# Patient Record
Sex: Female | Born: 1960 | Race: Black or African American | Hispanic: No | Marital: Single | Smoking: Never smoker
Health system: Southern US, Community
[De-identification: ages and names within clinical notes are randomized; demographics above are authoritative.]

## PROBLEM LIST (undated history)

## (undated) DIAGNOSIS — H409 Unspecified glaucoma: Secondary | ICD-10-CM

## (undated) DIAGNOSIS — K649 Unspecified hemorrhoids: Secondary | ICD-10-CM

## (undated) HISTORY — PX: GLAUCOMA SURGERY: SHX656

---

## 1998-08-21 ENCOUNTER — Ambulatory Visit: Admission: RE | Admit: 1998-08-21 | Discharge: 1998-08-21 | Payer: Self-pay | Admitting: *Deleted

## 1998-08-22 ENCOUNTER — Ambulatory Visit: Admission: RE | Admit: 1998-08-22 | Discharge: 1998-08-22 | Payer: Self-pay | Admitting: Internal Medicine

## 1998-08-24 ENCOUNTER — Ambulatory Visit: Admission: RE | Admit: 1998-08-24 | Discharge: 1998-08-24 | Payer: Self-pay | Admitting: Internal Medicine

## 2000-01-04 ENCOUNTER — Other Ambulatory Visit: Admission: RE | Admit: 2000-01-04 | Discharge: 2000-01-04 | Payer: Self-pay | Admitting: Obstetrics and Gynecology

## 2000-01-23 ENCOUNTER — Encounter: Payer: Self-pay | Admitting: Obstetrics and Gynecology

## 2000-01-23 ENCOUNTER — Ambulatory Visit (HOSPITAL_COMMUNITY): Admission: RE | Admit: 2000-01-23 | Discharge: 2000-01-23 | Payer: Self-pay | Admitting: Obstetrics and Gynecology

## 2001-07-02 ENCOUNTER — Other Ambulatory Visit: Admission: RE | Admit: 2001-07-02 | Discharge: 2001-07-02 | Payer: Self-pay | Admitting: Obstetrics and Gynecology

## 2001-07-31 ENCOUNTER — Emergency Department (HOSPITAL_COMMUNITY): Admission: EM | Admit: 2001-07-31 | Discharge: 2001-07-31 | Payer: Self-pay | Admitting: Emergency Medicine

## 2016-05-13 ENCOUNTER — Other Ambulatory Visit (HOSPITAL_COMMUNITY): Payer: Self-pay | Admitting: Ophthalmology

## 2016-05-13 DIAGNOSIS — H5340 Unspecified visual field defects: Secondary | ICD-10-CM

## 2016-05-21 ENCOUNTER — Ambulatory Visit (HOSPITAL_COMMUNITY): Admission: RE | Admit: 2016-05-21 | Payer: Self-pay | Source: Ambulatory Visit

## 2016-05-21 ENCOUNTER — Ambulatory Visit (HOSPITAL_COMMUNITY): Payer: Self-pay | Attending: Ophthalmology

## 2016-06-04 ENCOUNTER — Encounter (HOSPITAL_COMMUNITY): Payer: Self-pay

## 2016-06-04 ENCOUNTER — Ambulatory Visit (HOSPITAL_COMMUNITY)
Admission: RE | Admit: 2016-06-04 | Discharge: 2016-06-04 | Disposition: A | Discharge status: Home / Self Care | Payer: No Typology Code available for payment source | Source: Ambulatory Visit | Attending: Ophthalmology | Admitting: Ophthalmology

## 2016-06-04 DIAGNOSIS — R220 Localized swelling, mass and lump, head: Secondary | ICD-10-CM | POA: Insufficient documentation

## 2016-06-04 DIAGNOSIS — R9082 White matter disease, unspecified: Secondary | ICD-10-CM | POA: Insufficient documentation

## 2016-06-04 DIAGNOSIS — H5340 Unspecified visual field defects: Secondary | ICD-10-CM

## 2016-06-04 DIAGNOSIS — G319 Degenerative disease of nervous system, unspecified: Secondary | ICD-10-CM | POA: Insufficient documentation

## 2016-06-04 MED ORDER — GADOBENATE DIMEGLUMINE 529 MG/ML IV SOLN
15.0000 mL | Freq: Once | INTRAVENOUS | Status: AC
Start: 2016-06-04 — End: 2016-06-04
  Administered 2016-06-04: 13 mL via INTRAVENOUS

## 2016-06-22 ENCOUNTER — Ambulatory Visit (HOSPITAL_COMMUNITY)
Admission: EM | Admit: 2016-06-22 | Discharge: 2016-06-22 | Disposition: A | Discharge status: Home / Self Care | Payer: PRIVATE HEALTH INSURANCE | Attending: Radiology | Admitting: Radiology

## 2016-06-22 ENCOUNTER — Encounter (HOSPITAL_COMMUNITY): Payer: Self-pay | Admitting: Emergency Medicine

## 2016-06-22 DIAGNOSIS — R21 Rash and other nonspecific skin eruption: Secondary | ICD-10-CM | POA: Diagnosis not present

## 2016-06-22 HISTORY — DX: Unspecified glaucoma: H40.9

## 2016-06-22 MED ORDER — PERMETHRIN 5 % EX CREA: 1.0000 "application " | TOPICAL_CREAM | Freq: Once | CUTANEOUS | 2 refills | Status: AC

## 2016-06-22 NOTE — ED Provider Notes (Signed)
CSN: [id number]     Arrival [id number] & time 06/22/16  1202 History   None    Chief Complaint  Patient presents with  . Rash   (Consider location/radiation/quality/duration/timing/severity/associated sxs/prior Treatment) Patient presents with diffuse rash to her entire body X 1.5 months  X. Condition is acute in nature. Condition is made better by nothing. Condition is made worse at night and when the patient puts her jacket on. Patient denies any relief from acetone, benadryl and hydrocortisone cream prior to there arrival at this facility. Patient's husband as similar signs and symptoms. Patient denies any fevers, respiratory illness.       Past Medical History:  Diagnosis Date  . Glaucoma    History reviewed. No pertinent surgical history. History reviewed. No pertinent family history. Social History  Substance Use Topics  . Smoking status: Never Smoker  . Smokeless tobacco: Never Used  . Alcohol use No   OB History    No data available     Review of Systems  Constitutional: Negative.   HENT: Negative.   Respiratory: Negative.   Skin: Positive for rash (erythemic).    Allergies  Review of patient's allergies indicates no known allergies.  Home Medications   Prior to Admission medications   Medication Sig Start Date End Date Taking? Authorizing Provider  permethrin (ACTICIN) 5 % cream Apply 1 application topically once. 06/22/16 06/22/16  Alene MiresJennifer C Mykel Sponaugle, NP   Meds Ordered and Administered this Visit  Medications - No data to display  BP 169/86 (BP Location: Left Arm)   Pulse (!) 48   Temp 98.3 F (36.8 C) (Oral)   Resp 16   SpO2 100%  No data found.   Physical Exam  Constitutional: She is oriented to person, place, and time. She appears well-developed and well-nourished.  Neurological: She is alert and oriented to person, place, and time.  Skin: Skin is warm and dry. Rash noted. There is erythema.  Red bumps noted in between fingers up arm and at  hairline.    Urgent Care Course   Clinical Course    Procedures (including critical care time)  Labs Review Labs Reviewed - No data to display  Imaging Review No results found.   Visual Acuity Review  Right Eye Distance:   Left Eye Distance:   Bilateral Distance:    Right Eye Near:   Left Eye Near:    Bilateral Near:         MDM   1. Rash       Alene MiresJennifer C Toby Breithaupt, NP 06/22/16 1254

## 2016-06-22 NOTE — ED Triage Notes (Signed)
Pt c/o rash all over body onset 1.5 months... States husband started first w/rash and a few days later, she began  Denies fevers, chills, new meds  A&O x4... NAD

## 2016-06-22 NOTE — Discharge Instructions (Signed)
Apply to as directed once. Continue to use benadryl as directed at the back of the box

## 2018-01-11 IMAGING — MR MR HEAD WO/W CM
14 of 20 series · 32 of 48 positions shown · IV contrast (multihance)
Comparison: None.

CLINICAL DATA: 54-year-old female with glaucoma since mid 40s.
Visual field defect. No prior surgery. No history of cancer. Initial
encounter.

EXAM:
MRI HEAD AND ORBITS WITHOUT AND WITH CONTRAST
TECHNIQUE: Multiplanar, multiecho pulse sequences of the brain and surrounding
structures were obtained without and with intravenous contrast.
Multiplanar, multiecho pulse sequences of the orbits and surrounding
structures were obtained including fat saturation techniques, before
and after intravenous contrast administration.
CONTRAST:  13mL MULTIHANCE GADOBENATE DIMEGLUMINE 529 MG/ML IV SOLN

[Series 3: DWI · axial · 3.0mm · 0.94mm/px · z∈[-91,+33]mm · 7 of 92 slices shown (1 of 2)]
[im 1/92]
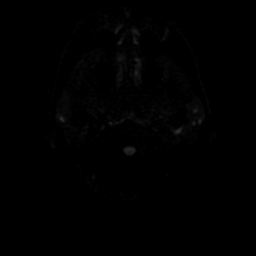
[im 16/92]
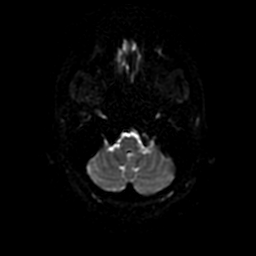
[im 31/92]
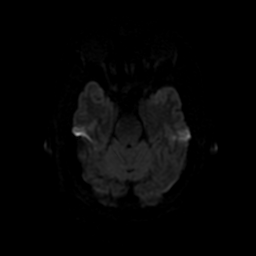
[im 46/92]
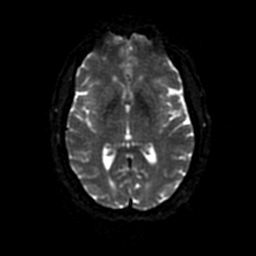
[im 61/92]
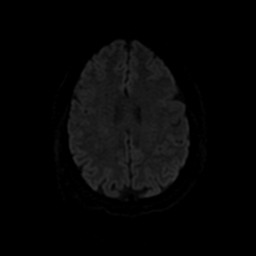
[im 76/92]
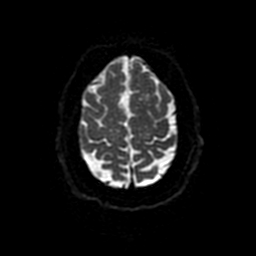
[im 92/92]
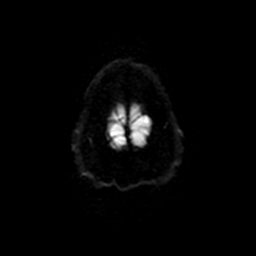

[Series 4: FLAIR · sagittal · 5.0mm · 0.47mm/px · 2 of 22 slices shown (1 of 3)]
[im 1/22]
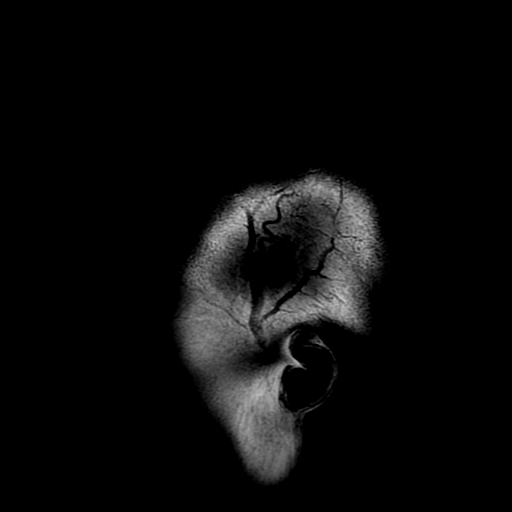
[im 22/22]
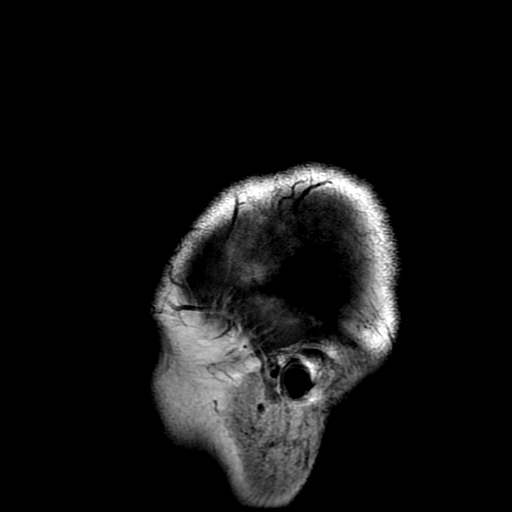

[Series 6: T2 · axial · 5.0mm · 0.47mm/px · z∈[-90,+31]mm · 2 of 23 slices shown]
[im 1/23]
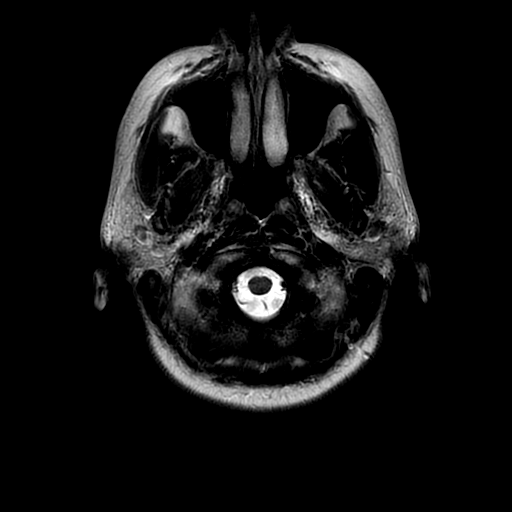
[im 23/23]
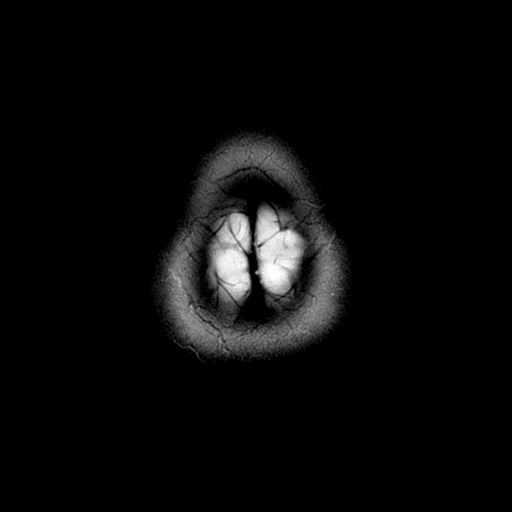

[Series 7: FLAIR · axial · 5.0mm · 0.94mm/px · z∈[-90,+31]mm · 2 of 23 slices shown (2 of 3)]
[im 1/23]
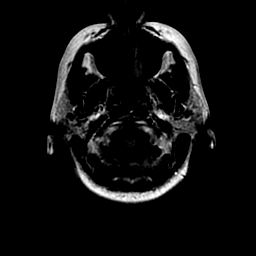
[im 23/23]
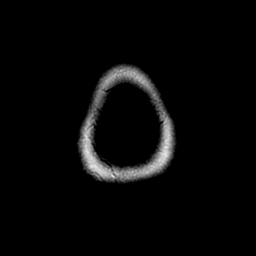

[Series 8: DWI · coronal · 4.0mm · 0.94mm/px · 4 of 68 slices shown (2 of 2)]
[im 1/68]
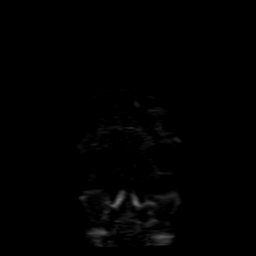
[im 23/68]
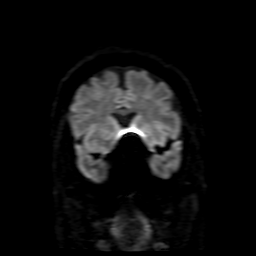
[im 45/68]
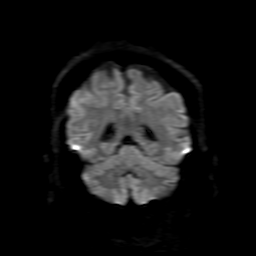
[im 68/68]
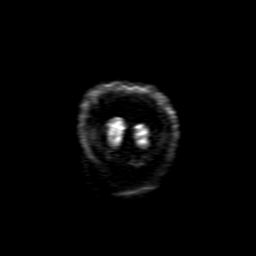

[Series 11: T2 fat-sat · coronal · 4.0mm · 0.35mm/px · 1 of 20 slices shown (1 of 2)]
[im 1/20]
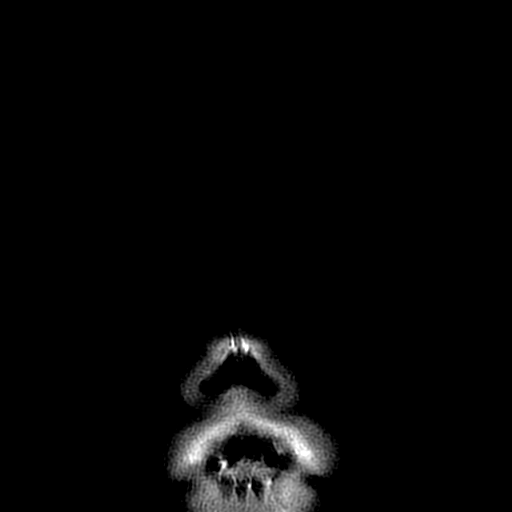

[Series 14: T2 fat-sat · axial · 3.0mm · 0.35mm/px · 1 of 20 slices shown (2 of 2)]
[im 1/20]
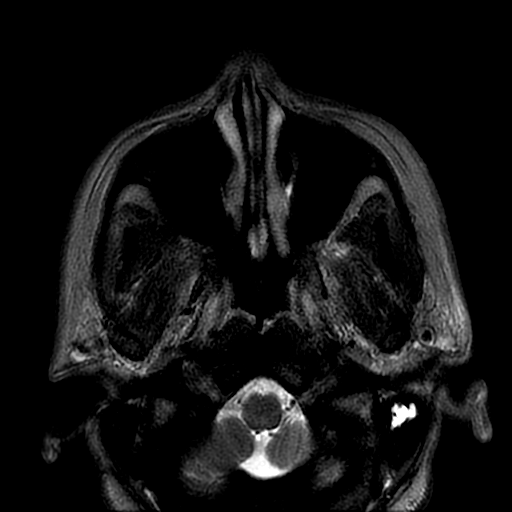

[Series 15: T1 · axial · 3.0mm · 0.35mm/px · 1 of 20 slices shown (1 of 2)]
[im 1/20]
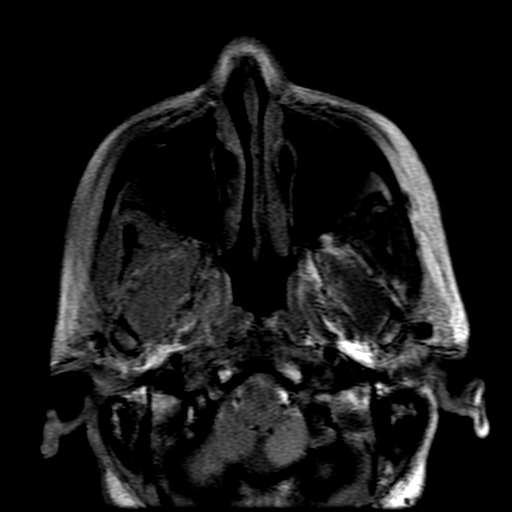

[Series 16: T1 · coronal · 4.0mm · 0.35mm/px · 1 of 20 slices shown (2 of 2)]
[im 1/20]
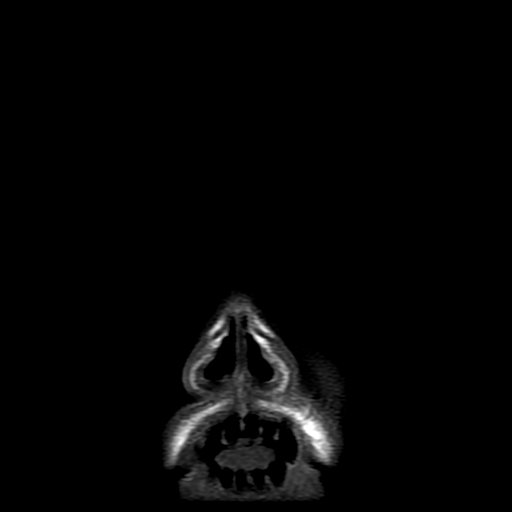

[Series 17: T2 post-contrast · coronal · 5.0mm · 0.39mm/px · 2 of 28 slices shown]
[im 1/28]
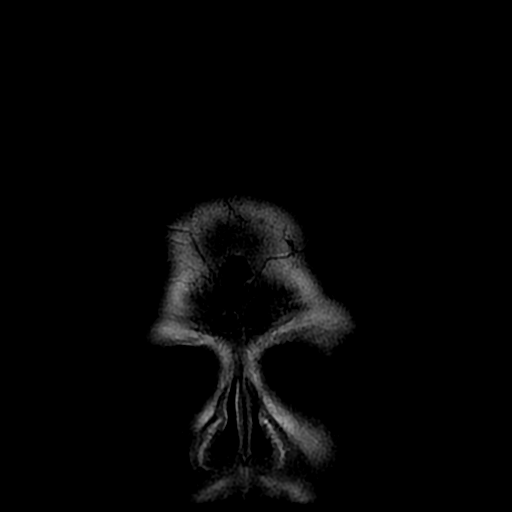
[im 28/28]
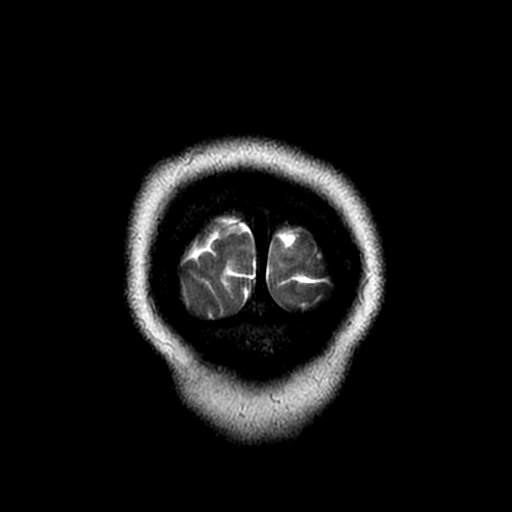

[Series 22: FLAIR · sagittal · 5.0mm · 0.47mm/px · 1 of 22 slices shown (3 of 3)]
[im 1/22]
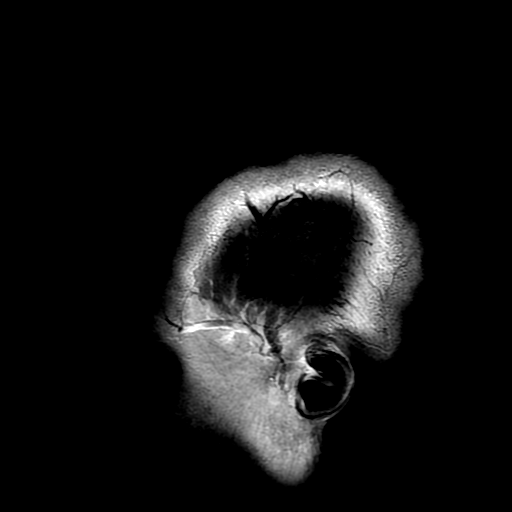

[Series 350: ADC · axial · 3.0mm · 0.94mm/px · z∈[-91,+33]mm · 3 of 46 slices shown (1 of 3)]
[im 1/46]
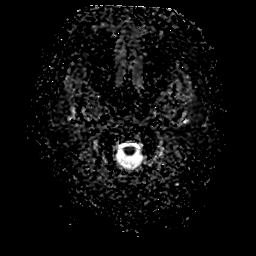
[im 23/46]
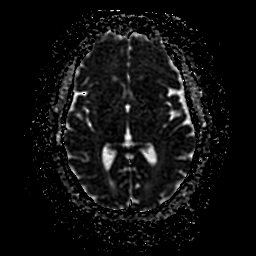
[im 46/46]
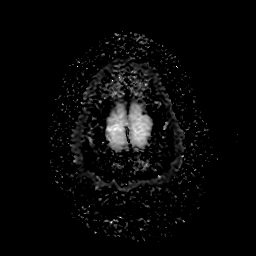

[Series 351: ADC · axial · 3.0mm · 0.94mm/px · z∈[-91,+33]mm · 3 of 46 slices shown (2 of 3)]
[im 1/46]
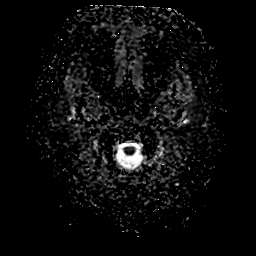
[im 23/46]
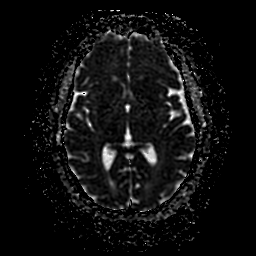
[im 46/46]
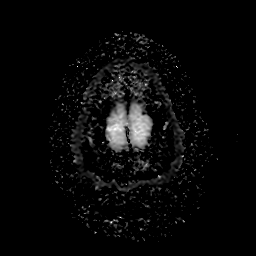

[Series 850: ADC · coronal · 4.0mm · 0.94mm/px · 2 of 34 slices shown (3 of 3)]
[im 1/34]
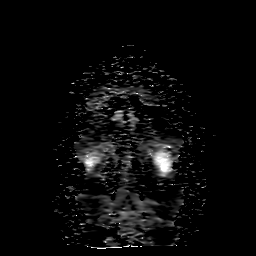
[im 34/34]
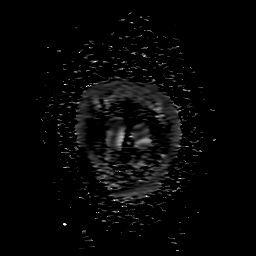

[32 of 48 positions shown; findings below may reference images not displayed]

FINDINGS: MRI HEAD FINDINGS

No acute infarct or intracranial hemorrhage.

Plaque-like mass situated between the posterior aspect of the left
aspect of the clivus and the ventral aspect of the left pons most
suggestive of an en plaque meningioma with maximal transverse
dimension of 1.4 x 0.8 cm.

Mild linear dural enhancement in a diffuse fashion. This is
nonspecific finding and can be seen related to remote prior trauma.
No leptomeningeal enhancement to suggest underlying infection.

Low level confluent periventricular and white matter
hyperintensities. This is nonspecific finding and can be seen
secondary to chronic microvascular changes. No typical features of
demyelinating process.

Atrophy most notable parietal lobes without hydrocephalus.

Diminutive size vertebral arteries and basilar artery. This may be
explained by fetal origin of the posterior cerebral arteries. Patent
internal carotid arteries and major dural sinuses.

Minimal partial opacification inferior left mastoid air cells.
Minimal mucosal thickening ethmoid sinus air cells.

Cervical medullary junction unremarkable.

Sella partially empty but not expanded.

MRI ORBITS FINDINGS

Artifact caused by bone air interface of the sphenoid sinus limits
evaluation of the optic nerves at the level of the optic canal.

Optic nerves appear slightly small in caliber without abnormal
enhancement of the optic nerves.

Slightly prominent peri optic spaces. Minimal enhancement optic
sheaths. Question normal versus related to increase pressure.

No primary global abnormality detected.

Symmetric normal appearance of extra-ocular muscles. Symmetric
appearance of lacrimal glands.

Minimal polypoid opacification inferior right maxillary sinus.
IMPRESSION: MRI HEAD

No acute infarct or intracranial hemorrhage.

Plaque-like mass situated between the posterior aspect of the left
aspect of the clivus and the ventral aspect of the left pons most
suggestive of an en plaque meningioma with maximal transverse
dimension of 1.4 x 0.8 cm.

Mild linear dural enhancement in a diffuse fashion. This is
nonspecific finding and can be seen related to remote prior trauma.

Low level confluent periventricular white matter hyperintensities.
This is nonspecific finding and can be seen secondary to chronic
microvascular changes.

Atrophy most notable parietal lobes.

Diminutive size vertebral arteries and basilar artery. This may be
explained by fetal origin of the posterior cerebral arteries.

Minimal partial opacification inferior left mastoid air cells.
Minimal mucosal thickening ethmoid sinus air cells.

MRI ORBITS

Artifact caused by bone air interface of the sphenoid sinus limits
evaluation of the optic nerves at the level of the optic canal.

Optic nerves appear slightly small in caliber without abnormal
enhancement of the optic nerves.

Slightly prominent peri optic spaces. Minimal enhancement optic
sheaths. Question normal versus related to increase pressure.

## 2020-02-26 ENCOUNTER — Ambulatory Visit: Payer: No Typology Code available for payment source

## 2020-02-26 ENCOUNTER — Ambulatory Visit: Payer: No Typology Code available for payment source | Attending: Internal Medicine

## 2020-02-26 DIAGNOSIS — Z23 Encounter for immunization: Secondary | ICD-10-CM

## 2020-02-26 NOTE — Progress Notes (Signed)
   Covid-19 Vaccination Clinic  Name:  Bernarda Erck    MRN: [id number] DOB: 1961-05-16  02/26/2020  Ms. Rosello was observed post Covid-19 immunization for 15 minutes without incident. She was provided with Vaccine Information Sheet and instruction to access the V-Safe system.   Ms. Goga was instructed to call 911 with any severe reactions post vaccine: Marland Kitchen Difficulty breathing  . Swelling of face and throat  . A fast heartbeat  . A bad rash all over body  . Dizziness and weakness   Immunizations Administered    Name Date Dose VIS Date Route   Pfizer COVID-19 Vaccine 02/26/2020  8:22 AM 0.3 mL 12/29/2018 Intramuscular   Manufacturer: [employer], [employer]   Lot: [id number]   NDC: [address]-5

## 2020-03-20 ENCOUNTER — Ambulatory Visit: Payer: No Typology Code available for payment source | Attending: Internal Medicine

## 2020-03-20 DIAGNOSIS — Z23 Encounter for immunization: Secondary | ICD-10-CM

## 2020-03-20 NOTE — Progress Notes (Signed)
   Covid-19 Vaccination Clinic  Name:  Eddie Koc    MRN: [id number] DOB: 08-15-1961  03/20/2020  Ms. Carrillo was observed post Covid-19 immunization for 15 minutes without incident. She was provided with Vaccine Information Sheet and instruction to access the V-Safe system.   Ms. Eads was instructed to call 911 with any severe reactions post vaccine: Marland Kitchen Difficulty breathing  . Swelling of face and throat  . A fast heartbeat  . A bad rash all over body  . Dizziness and weakness   Immunizations Administered    Name Date Dose VIS Date Route   Pfizer COVID-19 Vaccine 03/20/2020  8:22 AM 0.3 mL 12/29/2018 Intramuscular   Manufacturer: [employer], [employer]   Lot: [id number]   NDC: [address]-4

## 2020-05-27 ENCOUNTER — Emergency Department (HOSPITAL_COMMUNITY)
Admission: EM | Admit: 2020-05-27 | Discharge: 2020-05-28 | Disposition: A | Discharge status: Home / Self Care | Payer: No Typology Code available for payment source | Attending: Emergency Medicine | Admitting: Emergency Medicine

## 2020-05-27 ENCOUNTER — Encounter (HOSPITAL_COMMUNITY): Payer: Self-pay

## 2020-05-27 DIAGNOSIS — K644 Residual hemorrhoidal skin tags: Secondary | ICD-10-CM

## 2020-05-27 DIAGNOSIS — K648 Other hemorrhoids: Secondary | ICD-10-CM | POA: Insufficient documentation

## 2020-05-27 DIAGNOSIS — R5383 Other fatigue: Secondary | ICD-10-CM | POA: Insufficient documentation

## 2020-05-27 LAB — CBC
HCT: 29.4 % — ABNORMAL LOW (ref 36.0–46.0)
Hemoglobin: 8.6 g/dL — ABNORMAL LOW (ref 12.0–15.0)
MCH: 24.6 pg — ABNORMAL LOW (ref 26.0–34.0)
MCHC: 29.3 g/dL — ABNORMAL LOW (ref 30.0–36.0)
MCV: 84 fL (ref 80.0–100.0)
Platelets: 240 10*3/uL (ref 150–400)
RBC: 3.5 MIL/uL — ABNORMAL LOW (ref 3.87–5.11)
RDW: 13.6 % (ref 11.5–15.5)
WBC: 4.4 10*3/uL (ref 4.0–10.5)
nRBC: 0 % (ref 0.0–0.2)

## 2020-05-27 LAB — BASIC METABOLIC PANEL
Anion gap: 9 (ref 5–15)
BUN: 9 mg/dL (ref 6–20)
CO2: 22 mmol/L (ref 22–32)
Calcium: 9.2 mg/dL (ref 8.9–10.3)
Chloride: 109 mmol/L (ref 98–111)
Creatinine, Ser: 0.85 mg/dL (ref 0.44–1.00)
GFR calc Af Amer: >60 mL/min (ref >60)
GFR calc non Af Amer: >60 mL/min (ref >60)
Glucose, Bld: 109 mg/dL — ABNORMAL HIGH (ref 70–99)
Potassium: 3.6 mmol/L (ref 3.5–5.1)
Sodium: 140 mmol/L (ref 135–145)

## 2020-05-27 NOTE — ED Triage Notes (Addendum)
Pt arrives to ED w/ c/o rectal pain and constipation that she feels is secondary to hemorrhoids over the last 2 months. Last BM yesterday. Pt endorses increased fatigue over the last couple of weeks.

## 2020-05-28 ENCOUNTER — Other Ambulatory Visit: Payer: Self-pay

## 2020-05-28 MED ORDER — HYDROCORTISONE ACETATE 25 MG RE SUPP
25.0000 mg | Freq: Two times a day (BID) | RECTAL | 0 refills | Status: DC
Start: 2020-05-28 — End: 2021-06-01

## 2020-05-28 NOTE — ED Provider Notes (Signed)
[facility] EMERGENCY DEPARTMENT Provider Note   CSN: [id number] Arrival date & time: 05/27/20  1651     History Chief Complaint  Patient presents with  . Rectal Pain    Julie Dyer is a 59 y.o. female.  HPI    Patient reports she has had rectal pain for several months.  She reports she will have blood mixed in her stool at times.  No fevers or vomiting.  No abdominal pain.  She has tried Preparation H without improvement Her course is worsening.  It is worse with bowel movements. She reports that time she has decreased p.o. intake and this makes her feel weak. No weakness or syncope at this time Past Medical History:  Diagnosis Date  . Glaucoma     There are no problems to display for this patient.   History reviewed. No pertinent surgical history.   OB History   No obstetric history on file.     No family history on file.  Social History   Tobacco Use  . Smoking status: Never Smoker  . Smokeless tobacco: Never Used  Substance Use Topics  . Alcohol use: No  . Drug use: Not on file    Home Medications Prior to Admission medications   Medication Sig Start Date End Date Taking? Authorizing Provider  ALPHAGAN P 0.1 % SOLN Place 1 drop into the left eye 2 (two) times daily. 03/05/20  Yes [provider]  dorzolamide-timolol (COSOPT) 22.3-6.8 MG/ML ophthalmic solution Place 1 drop into the left eye 2 (two) times daily. 05/12/20  Yes [provider]  RHOPRESSA 0.02 % SOLN Place 1 drop into the left eye every evening. 05/08/20  Yes [provider]  VYZULTA 0.024 % SOLN Place 1 drop into the left eye every evening. 04/27/20  Yes [provider]  hydrocortisone (ANUSOL-HC) 25 MG suppository Place 1 suppository (25 mg total) rectally 2 (two) times daily. For 7 days 05/28/20   Zadie Rhine, MD    Allergies    Patient has no known allergies.  Review of Systems   Review of Systems  Constitutional: Positive for  fatigue. Negative for fever.  Gastrointestinal: Positive for blood in stool and rectal pain. Negative for vomiting.  Genitourinary: Negative for hematuria and vaginal bleeding.  Neurological: Negative for syncope.  All other systems reviewed and are negative.   Physical Exam Updated Vital Signs BP (!) 189/122   Pulse (!) 40   Temp 98.3 F (36.8 C) (Oral)   Resp 16   Ht 1.664 m (5' 5.5")   Wt 74.8 kg   SpO2 (!) 88%   BMI 27.04 kg/m   Physical Exam CONSTITUTIONAL: Well developed/well nourished HEAD: Normocephalic/atraumatic EYES: EOMI/PERRL, conjunctiva pink ENMT: Mucous membranes moist NECK: supple no meningeal signs SPINE/BACK:entire spine nontender CV: S1/S2 noted, no murmurs/rubs/gallops noted LUNGS: Lungs are clear to auscultation bilaterally, no apparent distress ABDOMEN: soft, nontender, no rebound or guarding, bowel sounds noted throughout abdomen Rectal-large external hemorrhoids are noted.  No blood or melena.  No obvious mass, no fistula.  No stool was noted.  Female nurse chaperone present for exam NEURO: Pt is awake/alert/appropriate, moves all extremitiesx4.  No facial droop.   EXTREMITIES:  full ROM SKIN: warm, color normal PSYCH: no abnormalities of mood noted, alert and oriented to situation  ED Results / Procedures / Treatments   Labs (all labs ordered are listed, but only abnormal results are displayed) Labs Reviewed  CBC - Abnormal; Notable for the following components:  Result Value   RBC 3.50 (*)    Hemoglobin 8.6 (*)    HCT 29.4 (*)    MCH 24.6 (*)    MCHC 29.3 (*)    All other components within normal limits  BASIC METABOLIC PANEL - Abnormal; Notable for the following components:   Glucose, Bld 109 (*)    All other components within normal limits  POC OCCULT BLOOD, ED    EKG None  Radiology No results found.  Procedures Procedures   Medications Ordered in ED Medications - No data to display  ED Course  I have reviewed the  triage vital signs and the nursing notes.  Pertinent labs  results that were available during my care of the patient were reviewed by me and considered in my medical decision making (see chart for details).    MDM Rules/Calculators/A&P                          PT Presents for rectal pain and hemorrhoids.  There are no masses or abscess noted.  She has large external hemorrhoids that are not actively bleeding.  Will place patient on Anusol.  She will be referred to general surgery for evaluation and potentially surgical management.  Patient reports she has never had GI follow-up, therefore she will need follow-up for colonoscopy in the next month.  Patient understands this is necessary for colon cancer screening Final Clinical Impression(s) / ED Diagnoses Final diagnoses:  External hemorrhoid    Rx / DC Orders ED Discharge Orders         Ordered    hydrocortisone (ANUSOL-HC) 25 MG suppository  2 times daily     Discontinue  Reprint     05/28/20 0618           Zadie Rhine, MD 05/28/20 [phone]

## 2020-05-28 NOTE — ED Provider Notes (Signed)
Suspect her hemoglobin is not acute.  Patient is nonsymptomatic this time.  This will need to be monitored as an outpatient.  Patient was hypertensive and likely due to anxiety.  Pulse ox reading was erroneous, patient was 100% on room air   Zadie Rhine, MD 05/28/20 [phone]

## 2020-09-07 ENCOUNTER — Other Ambulatory Visit: Payer: Self-pay

## 2021-05-29 ENCOUNTER — Encounter (HOSPITAL_COMMUNITY): Payer: Self-pay | Admitting: Internal Medicine

## 2021-05-29 ENCOUNTER — Emergency Department (HOSPITAL_COMMUNITY): Payer: Self-pay

## 2021-05-29 ENCOUNTER — Inpatient Hospital Stay (HOSPITAL_COMMUNITY)
Admission: EM | Admit: 2021-05-29 | Discharge: 2021-06-01 | DRG: 394 | Disposition: A | Discharge status: Home / Self Care | Payer: Self-pay | Attending: Internal Medicine | Admitting: Internal Medicine

## 2021-05-29 ENCOUNTER — Other Ambulatory Visit: Payer: Self-pay

## 2021-05-29 DIAGNOSIS — K644 Residual hemorrhoidal skin tags: Secondary | ICD-10-CM | POA: Diagnosis present

## 2021-05-29 DIAGNOSIS — K648 Other hemorrhoids: Secondary | ICD-10-CM

## 2021-05-29 DIAGNOSIS — K5900 Constipation, unspecified: Secondary | ICD-10-CM

## 2021-05-29 DIAGNOSIS — K649 Unspecified hemorrhoids: Secondary | ICD-10-CM

## 2021-05-29 DIAGNOSIS — K5730 Diverticulosis of large intestine without perforation or abscess without bleeding: Secondary | ICD-10-CM | POA: Diagnosis present

## 2021-05-29 DIAGNOSIS — K581 Irritable bowel syndrome with constipation: Secondary | ICD-10-CM | POA: Diagnosis present

## 2021-05-29 DIAGNOSIS — D62 Acute posthemorrhagic anemia: Secondary | ICD-10-CM | POA: Diagnosis present

## 2021-05-29 DIAGNOSIS — Z20822 Contact with and (suspected) exposure to covid-19: Secondary | ICD-10-CM | POA: Diagnosis present

## 2021-05-29 DIAGNOSIS — H409 Unspecified glaucoma: Secondary | ICD-10-CM | POA: Diagnosis present

## 2021-05-29 DIAGNOSIS — D509 Iron deficiency anemia, unspecified: Secondary | ICD-10-CM | POA: Diagnosis present

## 2021-05-29 DIAGNOSIS — R55 Syncope and collapse: Secondary | ICD-10-CM

## 2021-05-29 DIAGNOSIS — D649 Anemia, unspecified: Principal | ICD-10-CM

## 2021-05-29 DIAGNOSIS — K643 Fourth degree hemorrhoids: Principal | ICD-10-CM | POA: Diagnosis present

## 2021-05-29 DIAGNOSIS — Z79899 Other long term (current) drug therapy: Secondary | ICD-10-CM

## 2021-05-29 LAB — COMPREHENSIVE METABOLIC PANEL
ALT: 10 U/L (ref 0–44)
AST: 12 U/L — ABNORMAL LOW (ref 15–41)
Albumin: 3.8 g/dL (ref 3.5–5.0)
Alkaline Phosphatase: 34 U/L — ABNORMAL LOW (ref 38–126)
Anion gap: 9 (ref 5–15)
BUN: 5 mg/dL — ABNORMAL LOW (ref 6–20)
CO2: 26 mmol/L (ref 22–32)
Calcium: 9 mg/dL (ref 8.9–10.3)
Chloride: 102 mmol/L (ref 98–111)
Creatinine, Ser: 0.72 mg/dL (ref 0.44–1.00)
GFR, Estimated: >60 mL/min (ref >60)
Glucose, Bld: 116 mg/dL — ABNORMAL HIGH (ref 70–99)
Potassium: 3.3 mmol/L — ABNORMAL LOW (ref 3.5–5.1)
Sodium: 137 mmol/L (ref 135–145)
Total Bilirubin: 0.9 mg/dL (ref 0.3–1.2)
Total Protein: 6.8 g/dL (ref 6.5–8.1)

## 2021-05-29 LAB — CBC
HCT: 16.5 % — ABNORMAL LOW (ref 36.0–46.0)
Hemoglobin: 4.1 g/dL — CL (ref 12.0–15.0)
MCH: 16.2 pg — ABNORMAL LOW (ref 26.0–34.0)
MCHC: 24.8 g/dL — ABNORMAL LOW (ref 30.0–36.0)
MCV: 65.2 fL — ABNORMAL LOW (ref 80.0–100.0)
Platelets: 321 10*3/uL (ref 150–400)
RBC: 2.53 MIL/uL — ABNORMAL LOW (ref 3.87–5.11)
RDW: 19.9 % — ABNORMAL HIGH (ref 11.5–15.5)
WBC: 6 10*3/uL (ref 4.0–10.5)
nRBC: 0.3 % — ABNORMAL HIGH (ref 0.0–0.2)

## 2021-05-29 LAB — ABO/RH: ABO/RH(D): O POS

## 2021-05-29 LAB — POC OCCULT BLOOD, ED: Fecal Occult Bld: POSITIVE — AB

## 2021-05-29 LAB — PREPARE RBC (CROSSMATCH)

## 2021-05-29 MED ORDER — ACETAMINOPHEN 650 MG RE SUPP: 650.0000 mg | Freq: Four times a day (QID) | RECTAL | Status: DC | PRN

## 2021-05-29 MED ORDER — DORZOLAMIDE HCL-TIMOLOL MAL 2-0.5 % OP SOLN
1.0000 [drp] | Freq: Two times a day (BID) | OPHTHALMIC | Status: DC
  Administered 2021-05-30 – 2021-06-01 (×4): 1 [drp] via OPHTHALMIC
  Filled 2021-05-29: qty 10

## 2021-05-29 MED ORDER — DOCUSATE SODIUM 100 MG PO CAPS
100.0000 mg | ORAL_CAPSULE | Freq: Two times a day (BID) | ORAL | Status: DC
  Administered 2021-05-29: 100 mg via ORAL
  Filled 2021-05-29: qty 1

## 2021-05-29 MED ORDER — BISACODYL 5 MG PO TBEC: 5.0000 mg | DELAYED_RELEASE_TABLET | Freq: Every day | ORAL | Status: DC | PRN

## 2021-05-29 MED ORDER — LATANOPROSTENE BUNOD 0.024 % OP SOLN: 1.0000 [drp] | Freq: Every evening | OPHTHALMIC | Status: DC

## 2021-05-29 MED ORDER — ACETAMINOPHEN 325 MG PO TABS
650.0000 mg | ORAL_TABLET | Freq: Four times a day (QID) | ORAL | Status: DC | PRN
  Administered 2021-05-30 – 2021-05-31 (×2): 650 mg via ORAL
  Filled 2021-05-29 (×2): qty 2

## 2021-05-29 MED ORDER — DIBUCAINE (PERIANAL) 1 % EX OINT: TOPICAL_OINTMENT | CUTANEOUS | Status: DC | PRN

## 2021-05-29 MED ORDER — LATANOPROST 0.005 % OP SOLN
1.0000 [drp] | Freq: Every day | OPHTHALMIC | Status: DC
  Administered 2021-05-30 – 2021-05-31 (×2): 1 [drp] via OPHTHALMIC
  Filled 2021-05-29: qty 2.5

## 2021-05-29 MED ORDER — DOCUSATE SODIUM 100 MG PO CAPS: 100.0000 mg | ORAL_CAPSULE | Freq: Two times a day (BID) | ORAL | Status: DC

## 2021-05-29 MED ORDER — BRIMONIDINE TARTRATE 0.15 % OP SOLN
1.0000 [drp] | Freq: Two times a day (BID) | OPHTHALMIC | Status: DC
  Administered 2021-05-30 – 2021-06-01 (×4): 1 [drp] via OPHTHALMIC
  Filled 2021-05-29: qty 5

## 2021-05-29 MED ORDER — POLYETHYLENE GLYCOL 3350 17 G PO PACK
17.0000 g | PACK | Freq: Every day | ORAL | Status: DC | PRN
Start: 2021-05-29 — End: 2021-05-30

## 2021-05-29 MED ORDER — CALCIUM POLYCARBOPHIL 625 MG PO TABS
625.0000 mg | ORAL_TABLET | Freq: Two times a day (BID) | ORAL | Status: DC
  Filled 2021-05-29 (×4): qty 1

## 2021-05-29 MED ORDER — HYDROCORTISONE (PERIANAL) 2.5 % EX CREA
TOPICAL_CREAM | Freq: Two times a day (BID) | CUTANEOUS | Status: DC
  Filled 2021-05-29: qty 28.35

## 2021-05-29 MED ORDER — SENNA 8.6 MG PO TABS
1.0000 | ORAL_TABLET | Freq: Two times a day (BID) | ORAL | Status: DC
  Administered 2021-05-29: 8.6 mg via ORAL
  Filled 2021-05-29: qty 1

## 2021-05-29 MED ORDER — SODIUM CHLORIDE 0.9 % IV SOLN: 10.0000 mL/h | Freq: Once | INTRAVENOUS | Status: DC

## 2021-05-29 NOTE — ED Provider Notes (Signed)
Emergency Medicine Provider Triage Evaluation Note  Julie Dyer , a 60 y.o. female  was evaluated in triage.  Pt complains of rectal pain and pre syncopal episodes.  She was seen a year ago for hemorrhoids and unable to follow-up with specialist because of insurance issues.  For the last week she is having worsening rectal pain and bright red blood per rectum.  She is taking MiraLAX to try to keep her bowel movements soft.  Had an episode of diarrhea x4 days ago.  She states she has felt very weak and like she is going to pass out.  The rectal trauma.  Admits to subjective fevers at home.  Review of Systems  Positive: Rectal pain, rectal bleeding Negative: Chills, chest pain, abdominal pain, nausea, vomiting  Physical Exam  BP (!) 146/102   Pulse (!) 101   Temp 98.9 F (37.2 C)   Resp 18   SpO2 93%  Gen:   Awake, no distress   Resp:  Normal effort  MSK:   Moves extremities without difficulty  Other:  No abdominal tenderness  Medical Decision Making  Medically screening exam initiated at 5:57 PM.  Appropriate orders placed.  Lawson Isabell was informed that the remainder of the evaluation will be completed by another provider, this initial triage assessment does not replace that evaluation, and the importance of remaining in the ED until their evaluation is complete.  Basic labs ordered as well as type and screen as patient complaining of generalized weakness and presyncopal episodes in the setting of bleeding.  History provided by patient with additional history obtained from chart review.      Shanon Ace, PA-C 05/29/21 1759    Mancel Bale, MD 05/29/21 [phone]

## 2021-05-29 NOTE — ED Provider Notes (Signed)
[facility] EMERGENCY DEPARTMENT Provider Note   CSN: [id number] Arrival date & time: 05/29/21  1646     History Chief Complaint  Patient presents with   Loss of Consciousness   Weakness    Julie Dyer is a 60 y.o. female who presents to the ED today with complaint of a syncopal episode that occurred about 1-2 days ago while in the shower.  Endorses that she has a history of both internal and external hemorrhoids and for the past 1 to 2 weeks they have been bleeding quite profusely.  She states that she has had to take about 4 showers per day due to the amount of bleeding that she has had.  She states that 1 to 2 days ago while in the shower she felt lightheaded like she could pass out and then proceeded to pass out.  She also complains of generalized weakness as well as dyspnea on exertion.  She states she is unable to do any of the normal activities that she does without feeling winded.  She has never had symptoms like this in the past.  Denies any nausea, vomiting, abdominal pain.    The history is provided by the patient and medical records.      Past Medical History:  Diagnosis Date   Glaucoma     There are no problems to display for this patient.   No past surgical history on file.   OB History   No obstetric history on file.     No family history on file.  Social History   Tobacco Use   Smoking status: Never   Smokeless tobacco: Never  Substance Use Topics   Alcohol use: No    Home Medications Prior to Admission medications   Medication Sig Start Date End Date Taking? Authorizing Provider  ALPHAGAN P 0.1 % SOLN Place 1 drop into the left eye 2 (two) times daily. 03/05/20   [provider]  dorzolamide-timolol (COSOPT) 22.3-6.8 MG/ML ophthalmic solution Place 1 drop into the left eye 2 (two) times daily. 05/12/20   [provider]  hydrocortisone (ANUSOL-HC) 25 MG suppository Place 1 suppository (25 mg total) rectally 2 (two)  times daily. For 7 days 05/28/20   Zadie Rhine, MD  RHOPRESSA 0.02 % SOLN Place 1 drop into the left eye every evening. 05/08/20   [provider]  VYZULTA 0.024 % SOLN Place 1 drop into the left eye every evening. 04/27/20   [provider]    Allergies    Patient has no known allergies.  Review of Systems   Review of Systems  Eyes:  Negative for visual disturbance.  Respiratory:  Positive for shortness of breath.   Gastrointestinal:  Positive for anal bleeding. Negative for abdominal pain and nausea.  All other systems reviewed and are negative.  Physical Exam Updated Vital Signs BP (!) 149/90 (BP Location: Left Arm)   Pulse 79   Temp 98.3 F (36.8 C) (Oral)   Resp 18   SpO2 (!) 82%   Physical Exam Vitals and nursing note reviewed.  Constitutional:      Appearance: She is not ill-appearing.  HENT:     Head: Normocephalic and atraumatic.  Eyes:     Conjunctiva/sclera: Conjunctivae normal.  Cardiovascular:     Rate and Rhythm: Normal rate and regular rhythm.     Pulses: Normal pulses.  Pulmonary:     Effort: Tachypnea present.     Breath sounds: No wheezing, rhonchi or rales.  Abdominal:     Palpations: Abdomen is soft.     Tenderness: There is no abdominal tenderness. There is no guarding or rebound.  Genitourinary:    Comments: Chaperone present for GU exam. Multiple external hemorrhoids, no actively bleeding at this time, nonthrombosed. No melena appreciated.  Musculoskeletal:     Cervical back: Neck supple.  Skin:    General: Skin is warm and dry.     Coloration: Skin is pale.  Neurological:     Mental Status: She is alert.    ED Results / Procedures / Treatments   Labs (all labs ordered are listed, but only abnormal results are displayed) Labs Reviewed  COMPREHENSIVE METABOLIC PANEL - Abnormal; Notable for the following components:      Result Value   Potassium 3.3 (*)    Glucose, Bld 116 (*)    BUN 5 (*)    AST 12 (*)    Alkaline  Phosphatase 34 (*)    All other components within normal limits  CBC - Abnormal; Notable for the following components:   RBC 2.53 (*)    Hemoglobin 4.1 (*)    HCT 16.5 (*)    MCV 65.2 (*)    MCH 16.2 (*)    MCHC 24.8 (*)    RDW 19.9 (*)    nRBC 0.3 (*)    All other components within normal limits  POC OCCULT BLOOD, ED - Abnormal; Notable for the following components:   Fecal Occult Bld POSITIVE (*)    All other components within normal limits  SARS CORONAVIRUS 2 (TAT 6-24 HRS)  TYPE AND SCREEN  ABO/RH  PREPARE RBC (CROSSMATCH)    EKG None  Radiology No results found.  Procedures .Critical Care  Date/Time: 05/29/2021 9:45 PM Performed by: Tanda Rockers, PA-C Authorized by: Tanda Rockers, PA-C   Critical care provider statement:    Critical care time (minutes):  45   Critical care was time spent personally by me on the following activities:  Discussions with consultants, evaluation of patient's response to treatment, examination of patient, ordering and performing treatments and interventions, ordering and review of laboratory studies, ordering and review of radiographic studies, pulse oximetry, re-evaluation of patient's condition, obtaining history from patient or surrogate and review of old charts   Medications Ordered in ED Medications  0.9 %  sodium chloride infusion (0 mL/hr Intravenous Hold 05/29/21 2050)    ED Course  I have reviewed the triage vital signs and the nursing notes.  Pertinent labs & imaging results that were available during my care of the patient were reviewed by me and considered in my medical decision making (see chart for details).  Clinical Course as of 05/29/21 2145  Tue May 29, 2021  2045 Hemoglobin(!!): 4.1 [MV]    Clinical Course User Index [MV] Tanda Rockers, [place]   MDM Rules/Calculators/A&P                           60 year old female who presents to the ED today with complaint of rectal bleeding from internal/external  hemorrhoids for the past 1 to 2 weeks with associated generalized weakness, fatigue, shortness of breath as well as syncopal episode.  On arrival to the ED today patient was afebrile.  She was initially tachycardic at 101 however with repeat 79.  She appears to be winded on exam is mildly tachypneic with movement however at rest she is comfortable.  Initial O2 sat 93%, repeat 82%.  On  my exam patient's fingers have very low perfusion at this time is difficult to get an appropriate reading.  Pulse oximeter placed on ear with good waveform and 98% on room air.  Patient had lab work done while in the waiting room including a CBC with a hemoglobin of 4.1.  Baseline for patient 8.6 last year.  My exam she has multiple external hemorrhoids, nonthrombosed, not currently bleeding.  She will require blood transfusion and admission at this time.  Remainder of her physical exam is unremarkable.  Her abdomen is soft and nontender.  In complaint of shortness of breath we will plan for chest x-ray however suspect is secondary to her anemia.   Discussed case with internal medicine who agrees to evaluate patient for admission.   This note was prepared using Dragon voice recognition software and may include unintentional dictation errors due to the inherent limitations of voice recognition software.   Final Clinical Impression(s) / ED Diagnoses Final diagnoses:  Anemia, unspecified type  Hemorrhoids, unspecified hemorrhoid type  Syncope and collapse    Rx / DC Orders ED Discharge Orders     None        Tanda Rockers, PA-C 05/29/21 2146    Margarita Grizzle, MD 05/30/21 [phone]

## 2021-05-29 NOTE — ED Notes (Signed)
Admitting MD at bedside.

## 2021-05-29 NOTE — ED Triage Notes (Signed)
Pt reports bleeding from hemorrhoids x 1 week. Hx of same. However this time is having significant weakness, syncope while taking a shower two days ago, and muscle soreness.

## 2021-05-29 NOTE — ED Notes (Signed)
Blood bank called. Blood is ready.

## 2021-05-29 NOTE — ED Notes (Signed)
Digital signature pad not working (getting blood consent).     Pt able to sign printed consent and consent will be sent to medical records.

## 2021-05-29 NOTE — ED Notes (Signed)
2 MDs at bedside speaking to pt.

## 2021-05-29 NOTE — ED Notes (Signed)
MD still at bedside.

## 2021-05-29 NOTE — ED Notes (Signed)
RN got 2nd sample for blood bank as lab requested

## 2021-05-29 NOTE — Hospital Course (Addendum)
  Bleeding hemorrhoids Hgb at 4.1 on admission up to 8.1 on 7/27 s/p 2 units PRBCs, followed by repeated hemorrhoidal bleeding and Hgb down to 6.9 with nausea and weakness. Third unit PRBC given with Hgb now at 9.0. General surgery consulted with rec to pursue GI w/u. Consulted GI s/p Colonoscopy only showing large prolapsed hemorrhoids and GI rec to see General Surgery again. General Surgery saw pt and after discussion, pt oped to pursue conservative management first. Previously had similar episode last July for which she was referred to specialist but unable to keep appointment because she did not have insurance coverage.  -Toilet hygiene, avoid prolonged toilet sitting -Proctofoam TID -Mirilax BID -2 tablets Senna S daily -Sitz baths q 4hrs -Anusol external cream -PO or Rectal 650mg  Tylenol q6 -Able to see Colorectal surgeons at [facility] in f/u    Iron deficiency anemia Secondary to repeated hemorrhoidal blood loss. Ferritin very low at 3 with serum iron at 4. Pt now s/p IV Iron and 3 units PrBCs. Demonstrated stable Hgb since 7/28 am.

## 2021-05-29 NOTE — H&P (Addendum)
Date: 05/29/2021               Patient Name:  Julie Dyer MRN: [id number]  DOB: Feb 28, 1961 Age / Sex: 60 y.o., female   PCP: Patient, No Pcp Per (Inactive)         Medical Service: Internal Medicine Teaching Service         Attending Physician: Dr. Inez Catalina, MD    First Contact: Dr. Ellison Carwin  Pager: [phone]  Second Contact: Dr. Lerry Liner Pager: [phone]       After Hours (After 5p/  First Contact Pager: [phone]  weekends / holidays): Second Contact Pager: [phone]   Chief Complaint: "pain in my anal area and blood loss"  History of Present Illness:  Julie Dyer is a 60 yo F with PMH of glaucoma presents to ED due to 8 days of internal and external hemorrhoids causing "a lot" of blood loss multiple times a day and 8/10 pain in her rectum. She experienced a non-traumatic syncopal episode in the shower 2 days ago, and did not hit her head. Blood loss is bright red, up to "1/2 cup blood loss" per episode, and over the last 3 days there has been bright red blood mixed with very small amounts of "pebble-like" stool as well. She has chronic constipation, takes Mirilax, and has not had a significant bowel movement in days. Pt had a similar episode July 2021, which did not require hospitalization, and was referred to a specialist but did not follow up due to financial barriers.   Pt complains of weakness, palpitations, dizziness, DOE resulting in inability to carry out normal activities like laundry, and a non-traumatic syncopal episode in the shower.   Hg 4.1 today. BP 120/76, HR 75 Hg 8.1 during ED visit 1 year ago, was asymptomatic, and found to have large external hemorrhoids that were not actively bleeding. Placed pt on Anusol and referred to general surgery for evaluation and potential surgery, as well as a colonoscopy.  Meds:  No current facility-administered medications on file prior to encounter.   Current Outpatient Medications on File Prior to Encounter   Medication Sig Dispense Refill   brimonidine (ALPHAGAN) 0.2 % ophthalmic solution Place 1 drop into the left eye 2 (two) times daily.     dorzolamide-timolol (COSOPT) 22.3-6.8 MG/ML ophthalmic solution Place 1 drop into the left eye 2 (two) times daily.     polyethylene glycol (MIRALAX / GLYCOLAX) 17 g packet Take 17 g by mouth daily.     psyllium (METAMUCIL SMOOTH TEXTURE) 28 % packet Take 1 packet by mouth daily.     VYZULTA 0.024 % SOLN Place 1 drop into the left eye every evening.     hydrocortisone (ANUSOL-HC) 25 MG suppository Place 1 suppository (25 mg total) rectally 2 (two) times daily. For 7 days (Patient not taking: No sig reported) 14 suppository 0    Allergies: Allergies as of 05/29/2021   (No Known Allergies)   Past Medical History:  Diagnosis Date   Glaucoma     Family History:  Mom- glaucoma, HTN, T2DM, alcoholism Dad- alcoholism  Social History:  Lives with fiance and daughter. Pt denies smoking tobacco, and illicit drug use. Social EtOH use in past, but no longer drinks. Pt is able to performs her ADLs independently. Consumes a diet high in meats, and low in vegetables and fiber.   Review of Systems: A complete ROS was negative except as per HPI.   Physical Exam: Blood  pressure 109/75, pulse 82, temperature 98.3 F (36.8 C), temperature source Oral, resp. rate 17, SpO2 100 %. Physical Exam Constitutional:      General: She is not in acute distress.    Appearance: Normal appearance.  HENT:     Mouth/Throat:     Pharynx: Oropharynx is clear.  Eyes:     Pupils: Pupils are equal, round, and reactive to light.     Comments: Pale conjuntiva  Cardiovascular:     Rate and Rhythm: Regular rhythm.     Pulses: Normal pulses.     Heart sounds: Normal heart sounds.  Pulmonary:     Effort: Pulmonary effort is normal.     Breath sounds: Normal breath sounds.  Abdominal:     General: Abdomen is flat. There is no distension.     Palpations: Abdomen is soft.   Genitourinary:    Comments: Multiple, large external hemorrhoids up 2-3 cm in size with no active bleeding.  Skin:    General: Skin is warm and dry.  Neurological:     Mental Status: She is alert.    EKG: personally reviewed my interpretation is normal sinus rhythm.   CXR: personally reviewed my interpretation is normal heart size and mediastinal contours are WNL. Both lungs are clear.   Assessment & Plan by Problem: Active Problems:   Symptomatic anemia   Julie Dyer is a 60 yo F with PMH of glaucoma presenting to ED due to 8 days of hematochezia 2/2 large, painful external and internal hemorrhoids resulting in symptomatic anemia.   Symptomatic anemia 2/2 external and internal hemorrhoids Rectal Pain Patient presents due to symptomatic anemia, with weakness, palpitations, dizziness, DOE impacting her ability to perform daily activities, and a syncopal episode from ongoing hematochezia for 8 days, due to large external and internal hemorrhoids. Hg 4.1 on admission. Pt was previously seen in ED 1 year ago for hematochezia with Hg 8.1 but was asymptomatic at time.  -transfusing 2 units PRBC  -follow CBC, and then likely transfuse 1 additional unit  PRBC with goal Hg 7.  -started colace 100 mg BID, hydrocortisone rectal cream BID, dibucaine PRN, Mirilax PRN -fibercon 625 BID -pain management, acetaminophen 650 q6 hours PRN  -pt had plans to follow up with [facility] and [facility] at d/c July 2021. Plan to stabilize patient and consider resources to help her access specialists.   Glaucoma Started home latanoprost, brimonidine, and dorzolamide-timolol   Dispo: Admit patient to Observation with expected length of stay less than 2 midnights.  Signed: Carmel Sacramento, MD 05/29/2021, 10:19 PM  Internal Medicine Resident, PGY-1 Pager: [phone] After 5pm on weekdays and 1pm on weekends: On Call pager: [phone]

## 2021-05-30 DIAGNOSIS — D500 Iron deficiency anemia secondary to blood loss (chronic): Secondary | ICD-10-CM

## 2021-05-30 DIAGNOSIS — K5904 Chronic idiopathic constipation: Secondary | ICD-10-CM

## 2021-05-30 DIAGNOSIS — K644 Residual hemorrhoidal skin tags: Secondary | ICD-10-CM

## 2021-05-30 LAB — CBC
HCT: 24.7 % — ABNORMAL LOW (ref 36.0–46.0)
HCT: 27.7 % — ABNORMAL LOW (ref 36.0–46.0)
Hemoglobin: 7.2 g/dL — ABNORMAL LOW (ref 12.0–15.0)
Hemoglobin: 8.1 g/dL — ABNORMAL LOW (ref 12.0–15.0)
MCH: 21.4 pg — ABNORMAL LOW (ref 26.0–34.0)
MCH: 21.5 pg — ABNORMAL LOW (ref 26.0–34.0)
MCHC: 29.1 g/dL — ABNORMAL LOW (ref 30.0–36.0)
MCHC: 29.2 g/dL — ABNORMAL LOW (ref 30.0–36.0)
MCV: 73.3 fL — ABNORMAL LOW (ref 80.0–100.0)
MCV: 73.7 fL — ABNORMAL LOW (ref 80.0–100.0)
Platelets: 263 10*3/uL (ref 150–400)
Platelets: 264 10*3/uL (ref 150–400)
RBC: 3.37 MIL/uL — ABNORMAL LOW (ref 3.87–5.11)
RBC: 3.76 MIL/uL — ABNORMAL LOW (ref 3.87–5.11)
RDW: 25 % — ABNORMAL HIGH (ref 11.5–15.5)
RDW: 25 % — ABNORMAL HIGH (ref 11.5–15.5)
WBC: 5.8 10*3/uL (ref 4.0–10.5)
WBC: 6.9 10*3/uL (ref 4.0–10.5)
nRBC: 0 % (ref 0.0–0.2)
nRBC: 0 % (ref 0.0–0.2)

## 2021-05-30 LAB — IRON AND TIBC
Iron: 14 ug/dL — ABNORMAL LOW (ref 28–170)
Saturation Ratios: 3 % — ABNORMAL LOW (ref 10.4–31.8)
TIBC: 440 ug/dL (ref 250–450)
UIBC: 426 ug/dL

## 2021-05-30 LAB — HIV ANTIBODY (ROUTINE TESTING W REFLEX): HIV Screen 4th Generation wRfx: NONREACTIVE

## 2021-05-30 LAB — SARS CORONAVIRUS 2 (TAT 6-24 HRS): SARS Coronavirus 2: NEGATIVE

## 2021-05-30 LAB — FERRITIN: Ferritin: 3 ng/mL — ABNORMAL LOW (ref 11–307)

## 2021-05-30 MED ORDER — DOCUSATE SODIUM 100 MG PO CAPS
100.0000 mg | ORAL_CAPSULE | Freq: Two times a day (BID) | ORAL | Status: DC
  Administered 2021-05-30 – 2021-06-01 (×5): 100 mg via ORAL
  Filled 2021-05-30 (×5): qty 1

## 2021-05-30 MED ORDER — SENNOSIDES-DOCUSATE SODIUM 8.6-50 MG PO TABS
2.0000 | ORAL_TABLET | Freq: Every day | ORAL | Status: DC
  Administered 2021-05-30 – 2021-06-01 (×3): 2 via ORAL
  Filled 2021-05-30 (×3): qty 2

## 2021-05-30 MED ORDER — POLYETHYLENE GLYCOL 3350 17 G PO PACK
17.0000 g | PACK | Freq: Every day | ORAL | Status: DC
  Administered 2021-05-30 – 2021-06-01 (×3): 17 g via ORAL
  Filled 2021-05-30 (×3): qty 1

## 2021-05-30 NOTE — ED Notes (Signed)
Pt states feels much better this morning. Ambulated to bathroom with steady gait

## 2021-05-30 NOTE — ED Notes (Signed)
Provider at bedside

## 2021-05-30 NOTE — Progress Notes (Signed)
RN has called phlebotomist at [phone] and has gotten a "busy" signal. RN called lead phlebotomist and she said she would send someone over to collect.

## 2021-05-30 NOTE — Consult Note (Signed)
Consult Note  Julie Dyer 1961-09-03  [id number].    Requesting MD: Debe Coder, MD Chief Complaint/Reason for Consult: Hemorrhoids with anemia  HPI:  Patient is a 60 year old female who presented to [facility] today with rectal pain and external hemorrhoids. She reports she started having issues 1.5 weeks ago with bleeding and pain and over this time has noted progressive dizziness, SOB, fatigue, feeling cold. She had a syncopal episode about 3 days ago. She was seen for the same problem about 1 year ago and was referred to [facility] colorectal surgery then but she did not have health insurance at the time and so she became lost to follow up. PMH otherwise significant for glaucoma and chronic constipation. Patient takes miralx and colace daily but only has a BM every 4-5 days. Then on days she has a BM she may have 4 movements in one day. Patient is not on any blood thinners. She has not ever seen GI or had a colonoscopy.   ROS: Review of Systems  Constitutional:  Positive for chills and malaise/fatigue. Negative for fever.  Respiratory:  Positive for shortness of breath. Negative for wheezing.   Cardiovascular:  Negative for chest pain and palpitations.  Gastrointestinal:  Positive for blood in stool and constipation. Negative for abdominal pain, nausea and vomiting.       Rectal pain   Genitourinary:  Negative for dysuria and urgency.  Neurological:  Positive for dizziness, loss of consciousness and weakness.  All other systems reviewed and are negative.  Family History  Problem Relation Age of Onset   Diabetes Mother    Alcohol abuse Mother    Alcohol abuse Father     Past Medical History:  Diagnosis Date   Glaucoma     No past surgical history on file.  Social History:  reports that she has never smoked. She has never used smokeless tobacco. She reports previous alcohol use. She reports previous drug use.  Allergies: No Known Allergies  (Not in a hospital  admission)   Blood pressure 129/90, pulse 61, temperature 98.4 F (36.9 C), temperature source Oral, resp. rate 17, SpO2 100 %. Physical Exam:  General: pleasant, WD, WN female who is laying in bed in NAD HEENT: head is normocephalic, atraumatic.  Sclera are noninjected.  PERRL.  Ears and nose without any masses or lesions.  Mouth is pink and moist Heart: regular, rate, and rhythm.  Normal s1,s2. No obvious murmurs, gallops, or rubs noted.  Palpable radial and pedal pulses bilaterally Lungs: CTAB, no wheezes, rhonchi, or rales noted.  Respiratory effort nonlabored Abd: soft, NT, ND, +BS, no masses, hernias, or organomegaly GU: thrombosed external hemorrhoids MS: all 4 extremities are symmetrical with no cyanosis, clubbing, or edema. Skin: warm and dry with no masses, lesions, or rashes Neuro: Cranial nerves 2-12 grossly intact, sensation is normal throughout Psych: A&Ox3 with an appropriate affect.   Results for orders placed or performed during the hospital encounter of 05/29/21 (from the past 48 hour(s))  Comprehensive metabolic panel     Status: Abnormal   Collection Time: 05/29/21  5:48 PM  Result Value Ref Range   Sodium 137 135 - 145 mmol/L   Potassium 3.3 (L) 3.5 - 5.1 mmol/L   Chloride 102 98 - 111 mmol/L   CO2 26 22 - 32 mmol/L   Glucose, Bld 116 (H) 70 - 99 mg/dL    Comment: Glucose reference range applies only to samples taken after fasting for at least  8 hours.   BUN 5 (L) 6 - 20 mg/dL   Creatinine, Ser 8.29 0.44 - 1.00 mg/dL   Calcium 9.0 8.9 - 56.2 mg/dL   Total Protein 6.8 6.5 - 8.1 g/dL   Albumin 3.8 3.5 - 5.0 g/dL   AST 12 (L) 15 - 41 U/L   ALT 10 0 - 44 U/L   Alkaline Phosphatase 34 (L) 38 - 126 U/L   Total Bilirubin 0.9 0.3 - 1.2 mg/dL   GFR, Estimated >13 >08 mL/min    Comment: (NOTE) Calculated using the CKD-EPI Creatinine Equation (2021)    Anion gap 9 5 - 15    Comment: Performed at [facility], [address], [place], [place] [address]  CBC      Status: Abnormal   Collection Time: 05/29/21  5:48 PM  Result Value Ref Range   WBC 6.0 4.0 - 10.5 K/uL   RBC 2.53 (L) 3.87 - 5.11 MIL/uL   Hemoglobin 4.1 (LL) 12.0 - 15.0 g/dL    Comment: REPEATED TO VERIFY Reticulocyte Hemoglobin testing may be clinically indicated, consider ordering this additional test [id number] CRITICAL RESULT CALLED TO, READ BACK BY AND VERIFIED WITH: MEGAN RUGGELIA,RN 1910 05/29/2021 WBOND    HCT 16.5 (L) 36.0 - 46.0 %   MCV 65.2 (L) 80.0 - 100.0 fL   MCH 16.2 (L) 26.0 - 34.0 pg   MCHC 24.8 (L) 30.0 - 36.0 g/dL   RDW 41.3 (H) 24.4 - 01.0 %   Platelets 321 150 - 400 K/uL   nRBC 0.3 (H) 0.0 - 0.2 %    Comment: Performed at [facility], [address], [place], [place] [address]  Type and screen [facility]     Status: None (Preliminary result)   Collection Time: 05/29/21  5:54 PM  Result Value Ref Range   ABO/RH(D) O POS    Antibody Screen NEG    Sample Expiration 06/01/2021,2359    Unit Number [id number]    Blood Component Type RBC LR PHER2    Unit division 00    Status of Unit ISSUED    Transfusion Status OK TO TRANSFUSE    Crossmatch Result Compatible    Unit Number [id number]    Blood Component Type RED CELLS,LR    Unit division 00    Status of Unit ISSUED,FINAL    Transfusion Status OK TO TRANSFUSE    Crossmatch Result      Compatible Performed at [facility], [address], [place], [place] [address]   Prepare RBC (crossmatch)     Status: None   Collection Time: 05/29/21  8:47 PM  Result Value Ref Range   Order Confirmation      ORDER PROCESSED BY BLOOD BANK Performed at [facility], [address] [place], [place] [address]   SARS CORONAVIRUS 2 (TAT 6-24 HRS) Nasopharyngeal Nasopharyngeal Swab     Status: None   Collection Time: 05/29/21  8:47 PM   Specimen: Nasopharyngeal Swab  Result Value Ref Range   SARS Coronavirus 2 NEGATIVE NEGATIVE    Comment: (NOTE) SARS-CoV-2 target nucleic  acids are NOT DETECTED.  The SARS-CoV-2 RNA is generally detectable in upper and lower respiratory specimens during the acute phase of infection. Negative results do not preclude SARS-CoV-2 infection, do not rule out co-infections with other pathogens, and should not be used as the sole basis for treatment or other patient management decisions. Negative results must be combined with clinical observations,  patient history, and epidemiological information. The expected result is Negative.  Fact Sheet for Patients: HairSlick.no  Fact Sheet for Healthcare Providers: quierodirigir.com  This test is not yet approved or cleared by the [place] FDA and  has been authorized for detection and/or diagnosis of SARS-CoV-2 by FDA under an Emergency Use Authorization (EUA). This EUA will remain  in effect (meaning this test can be used) for the duration of the COVID-19 declaration under Se ction 564(b)(1) of the Act, 21 U.S.C. section 360bbb-3(b)(1), unless the authorization is terminated or revoked sooner.  Performed at [facility], [address], [place] , [place] [address]   POC occult blood, ED     Status: Abnormal   Collection Time: 05/29/21  9:34 PM  Result Value Ref Range   Fecal Occult Bld POSITIVE (A) NEGATIVE  ABO/Rh     Status: None   Collection Time: 05/29/21 10:28 PM  Result Value Ref Range   ABO/RH(D)      O POS Performed at [facility], [address], [place], [place] [address]   CBC     Status: Abnormal   Collection Time: 05/30/21  8:25 AM  Result Value Ref Range   WBC 5.8 4.0 - 10.5 K/uL   RBC 3.37 (L) 3.87 - 5.11 MIL/uL   Hemoglobin 7.2 (L) 12.0 - 15.0 g/dL    Comment: REPEATED TO VERIFY POST TRANSFUSION SPECIMEN Reticulocyte Hemoglobin testing may be clinically indicated, consider ordering this additional test [id number]    HCT 24.7 (L) 36.0 - 46.0 %   MCV 73.3 (L) 80.0 - 100.0 fL     Comment: POST TRANSFUSION SPECIMEN REPEATED TO VERIFY    MCH 21.4 (L) 26.0 - 34.0 pg   MCHC 29.1 (L) 30.0 - 36.0 g/dL   RDW 56.2 (H) 13.0 - 86.5 %   Platelets 263 150 - 400 K/uL    Comment: REPEATED TO VERIFY   nRBC 0.0 0.0 - 0.2 %    Comment: Performed at [facility], [address], [place], [place] [address]   DG Chest Port 1 View  Result Date: 05/29/2021 CLINICAL DATA:  Weakness and syncope. EXAM: PORTABLE CHEST 1 VIEW COMPARISON:  February 10, 2009 FINDINGS: The heart size and mediastinal contours are within normal limits. Both lungs are clear. The visualized skeletal structures are unremarkable. IMPRESSION: No active cardiopulmonary disease. Electronically Signed   By: Aram Candela M.D.   On: 05/29/2021 21:51      Assessment/Plan Chronic constipation  External hemorrhoids - no active bleeding on exam, recommend stool softeners/sitz baths, can use anusol prn - no indication for urgent/emergent surgical intervention at this time - ultimately needs GI follow up for management of chronic constipation, if she could get this under better control then hemorrhoids would likely improve as well - also does not appear she has ever had colonoscopy  ABL anemia - secondary to above - hgb was 4.1 on arrival, s/p 2 uPRBC hgb now 7.2 - recommend trending hgb  FEN: soft diet  VTE: SCDs, no chemical prophylaxis in setting of anemia  ID: no abx indicated at this time  Juliet Rude, [facility] 05/30/2021, 1:01 PM Please see Amion for pager number during day hours 7:00am-4:30pm

## 2021-05-30 NOTE — Progress Notes (Signed)
Patient was reevaluated this afternoon to discuss plan of care.  Of note she provided additional history stating that she has had constipation for 7 years which has been intermittent but progressive in nature.  She describes large caliber bowel movements.  However 1 week ago she noticed pencil thin stool.  She endorses clots in the toilet bowl.  Assessment/plan: Patient has remained hemodynamically stable and hemoglobin has improved to 8.1 after receiving 2 units of PRBCs.  Initially thought to be due to internal/external hemorrhoids however there is concern for a GI bleed.  Will consult GI for possible inpatient colonoscopy to further evaluate.  Continue to monitor blood counts while in house.  Lerry Liner, DO PGY-3 IMTS

## 2021-05-30 NOTE — Progress Notes (Signed)
   Subjective:  Julie Dyer is a 60 y/o female with glaucoma admitted with symptomatic anemia secondary to internal and external hemorrhoids.  She notes that she is feeling much better after receiving two units of blood overnight. She was able to stand and ambulate to the bathroom but unfortunately had more blood appear in the toilet from her bleeding hemorrhoids, estimating 1/8 cup of bright red blood loss. She was not able to move her bowels and endorses chronic constipation. Hgb before transfusions was at 4.1.  The pt had a prior episode of blood loss last summer from her hemorrhoids and was referred to GI and General surgery. She wasn't able to follow up with either practice noting that she was unemployed and uninsured at that time and "didn't want to be a burden to the system, that is already so overwhelmed."   Objective:  Vital signs in last 24 hours: Vitals:   05/30/21 0700 05/30/21 0715 05/30/21 1031 05/30/21 1200  BP: 139/75 (!) 156/82 (!) 148/74 129/90  Pulse: 67 68 65 61  Resp: 15 17 13 17   Temp:      TempSrc:      SpO2: 100% 100% 99% 100%   Weight change:  No intake or output data in the 24 hours ending 05/30/21 1322  General: Very kind, pleasant, well-appearing woman laying in bed. No acute distress. Head: Normocephalic. Atraumatic. Conjunctival pallor. CV: RRR. No murmurs, rubs, or gallops. No LE edema Pulmonary: Lungs CTAB. Normal effort. No wheezing or rales. Abdominal: Soft, nontender, nondistended. Normal bowel sounds. Extremities: Palpable radial and DP pulses. Normal ROM. Skin: Warm and dry. No obvious rash or lesions. Neuro: A&Ox3. Moves all extremities. Normal sensation. No focal deficit. Psych: Normal mood and affect   Assessment/Plan:  Active Problems:   Symptomatic anemia  Bleeding hemorrhoids Pt presenting with Hgb at 4.1 on admission secondary to internal and external hemorrhoids. Prior episodes of substantial bleeding last summer. Has chronic  constipation. Multiple, large external hemorrhoids up to 2-3cm in size seen on initial physical exam.  -Consulted General Surgery for further management -Hydrocortisone rectal cream BID -Dibucaine prn -Mirilax prn -625mg  Fibercon 625 BID -Senna S daily -650mg  Tylenol prn  Iron deficiency anemia Hgb improved from 4.1 to 7.2 s/p 2 units PRBCs. MCV at 73.3. Secondary to hemorrhoids as above. Further blood loss reported after receiving transfusion. -Checking CBC again 6 hours after last, to evaluate Hgb and possible need for an additional unit of blood   -Checking Ferritin and Iron panel. Will consider PO Iron replacement with discharge  Prior to admission living arrangement: home Anticipated discharge location: home Barriers to discharge: continued hemorrhoid bleeding Dispo: home in 1 day   LOS: 0 days   08-30-1975, Medical Student 05/30/2021, 1:22 PM

## 2021-05-31 ENCOUNTER — Inpatient Hospital Stay (HOSPITAL_COMMUNITY): Payer: Self-pay

## 2021-05-31 DIAGNOSIS — K5900 Constipation, unspecified: Secondary | ICD-10-CM

## 2021-05-31 LAB — HEMOGLOBIN AND HEMATOCRIT, BLOOD
HCT: 23.2 % — ABNORMAL LOW (ref 36.0–46.0)
HCT: 28.8 % — ABNORMAL LOW (ref 36.0–46.0)
Hemoglobin: 6.9 g/dL — CL (ref 12.0–15.0)
Hemoglobin: 8.5 g/dL — ABNORMAL LOW (ref 12.0–15.0)

## 2021-05-31 LAB — FIBRINOGEN: Fibrinogen: 353 mg/dL (ref 210–475)

## 2021-05-31 LAB — PREPARE RBC (CROSSMATCH)

## 2021-05-31 LAB — GLUCOSE, CAPILLARY: Glucose-Capillary: 103 mg/dL — ABNORMAL HIGH (ref 70–99)

## 2021-05-31 MED ORDER — HYDROMORPHONE HCL 1 MG/ML IJ SOLN
0.5000 mg | Freq: Once | INTRAMUSCULAR | Status: AC
Start: 2021-05-31 — End: 2021-05-31
  Administered 2021-05-31: 0.5 mg via INTRAVENOUS
  Filled 2021-05-31: qty 1

## 2021-05-31 MED ORDER — METOCLOPRAMIDE HCL 5 MG/ML IJ SOLN
10.0000 mg | Freq: Once | INTRAMUSCULAR | Status: AC
  Administered 2021-05-31: 10 mg via INTRAVENOUS
  Filled 2021-05-31: qty 2

## 2021-05-31 MED ORDER — BISACODYL 5 MG PO TBEC
20.0000 mg | DELAYED_RELEASE_TABLET | Freq: Once | ORAL | Status: AC
  Administered 2021-05-31: 20 mg via ORAL
  Filled 2021-05-31: qty 4

## 2021-05-31 MED ORDER — SODIUM CHLORIDE 0.9 % IV SOLN
250.0000 mg | Freq: Every day | INTRAVENOUS | Status: AC
  Administered 2021-05-31 – 2021-06-01 (×2): 250 mg via INTRAVENOUS
  Filled 2021-05-31 (×2): qty 20

## 2021-05-31 MED ORDER — PEG-KCL-NACL-NASULF-NA ASC-C 100 G PO SOLR: 1.0000 | Freq: Once | ORAL | Status: DC

## 2021-05-31 MED ORDER — PEG-KCL-NACL-NASULF-NA ASC-C 100 G PO SOLR
0.5000 | Freq: Once | ORAL | Status: AC
  Administered 2021-05-31: 100 g via ORAL
  Filled 2021-05-31: qty 1

## 2021-05-31 MED ORDER — ONDANSETRON HCL 4 MG/2ML IJ SOLN
4.0000 mg | Freq: Four times a day (QID) | INTRAMUSCULAR | Status: DC | PRN
  Administered 2021-05-31: 4 mg via INTRAVENOUS
  Filled 2021-05-31: qty 2

## 2021-05-31 MED ORDER — ACETAMINOPHEN 325 MG PO TABS
650.0000 mg | ORAL_TABLET | Freq: Four times a day (QID) | ORAL | Status: DC
  Administered 2021-05-31 – 2021-06-01 (×4): 650 mg via ORAL
  Filled 2021-05-31 (×4): qty 2

## 2021-05-31 MED ORDER — ACETAMINOPHEN 650 MG RE SUPP: 650.0000 mg | Freq: Four times a day (QID) | RECTAL | Status: DC

## 2021-05-31 MED ORDER — SODIUM CHLORIDE 0.9% IV SOLUTION: Freq: Once | INTRAVENOUS | Status: AC

## 2021-05-31 NOTE — Consult Note (Addendum)
[facility] Consult: 8:10 AM 05/31/2021  LOS: 1 day    Referring Provider: Dr Criselda Peaches  Primary Care Physician:  Patient, No Pcp Per (Inactive) Primary Gastroenterologist: none.  unassigned     Reason for Consultation: Hematochezia.  Hemorrhoids.   HPI: Marnie Fazzino is a 60 y.o. female.  PMH glaucoma.  Anemia.   Presented to ED 2 days ago.  About a week prior she developed hematochezia.  Some associated rectoanal pain.  Episodes hematochezia occur with and without brown stool.  Occurring 3 or 4 times a day.  Patient has a many year history of chronic constipation moving her bowels 2-3 times per week.  In recent months she moves her bowels every 4 days.  Uses MiraLAX periodically and more recently has been using this once daily.  About a year ago she had rectal bleeding attributed to hemorrhoids.  Was never seen by a GI specialist. Presented to ED and admitted 2 days ago.  Bleeding seemed to subside initially but was aggressive yesterday.  Has developed discomfort in the lower abdomen that is worse when she is about to pass blood or stool.  Continues to have pain in the anus on defecation.  Initial blood pressures in the high 90s/60s but more recently normal or hypertensive.  No tachycardia.  Excellent oxygen sats.   On rectal exam has large hemorrhoids. Hgb 8.6 >> 4.1 >> 2 PRBCs >> 7.2.  MCV 65.  BUN normal.  Iron level 14, ferritin 3.  Low iron sats.  Patient initiated on MiraLAX yesterday.  Also receiving daily Senokot, bid Colace, fibercon, topcal Anusol and dibucaine.  Ferrlecit infusion is ordered.  This morning she is nauseated.  This was not an issue PTA but patient had adjusted her food intake mostly to yogurt and applesauce thinking that soft foods would help her move her bowels.  No alcohol, no  cigarettes.  Unemployed, previously worked at the [employer]. Family history negative for blood loss.  Anemia.  Colorectal cancer  Past Medical History:  Diagnosis Date   Glaucoma     No past surgical history on file.  Prior to Admission medications   Medication Sig Start Date End Date Taking? Authorizing Provider  brimonidine (ALPHAGAN) 0.2 % ophthalmic solution Place 1 drop into the left eye 2 (two) times daily. 03/05/21  Yes [provider]  dorzolamide-timolol (COSOPT) 22.3-6.8 MG/ML ophthalmic solution Place 1 drop into the left eye 2 (two) times daily. 05/12/20  Yes [provider]  polyethylene glycol (MIRALAX / GLYCOLAX) 17 g packet Take 17 g by mouth daily.   Yes [provider]  psyllium (METAMUCIL SMOOTH TEXTURE) 28 % packet Take 1 packet by mouth daily.   Yes [provider]  VYZULTA 0.024 % SOLN Place 1 drop into the left eye every evening. 04/27/20  Yes [provider]  hydrocortisone (ANUSOL-HC) 25 MG suppository Place 1 suppository (25 mg total) rectally 2 (two) times daily. For 7 days Patient not taking: No sig reported 05/28/20   Zadie Rhine, MD    Scheduled Meds:  brimonidine  1 drop Left Eye BID   docusate sodium  100 mg Oral BID   dorzolamide-timolol  1 drop Left Eye BID   hydrocortisone   Rectal BID   latanoprost  1 drop Left Eye QHS   polycarbophil  625 mg Oral BID   polyethylene glycol  17 g Oral Daily   senna-docusate  2 tablet Oral Daily   Infusions:  sodium chloride Stopped (05/29/21 2050)   ferric gluconate (FERRLECIT) IVPB     PRN Meds: acetaminophen **OR** acetaminophen, dibucaine, ondansetron (ZOFRAN) IV   Allergies as of 05/29/2021   (No Known Allergies)    Family History  Problem Relation Age of Onset   Diabetes Mother    Alcohol abuse Mother    Alcohol abuse Father     Social History   Socioeconomic History   Marital status: Single    Spouse name: Not on file   Number of children:  Not on file   Years of education: Not on file   Highest education level: Not on file  Occupational History   Not on file  Tobacco Use   Smoking status: Never   Smokeless tobacco: Never  Substance and Sexual Activity   Alcohol use: Not Currently   Drug use: Not Currently   Sexual activity: Not on file  Other Topics Concern   Not on file  Social History Narrative   Not on file   Social Determinants of Health   Financial Resource Strain: Not on file  Food Insecurity: Not on file  Transportation Needs: Not on file  Physical Activity: Not on file  Stress: Not on file  Social Connections: Not on file  Intimate Partner Violence: Not on file    REVIEW OF SYSTEMS: Constitutional: Feeling weak. ENT:  No nose bleeds Pulm: No shortness of breath.  No cough. CV:  No palpitations, no LE edema.  No angina. GU:  No hematuria, no frequency GI: See HPI.  No dysphagia.  No heartburn. Gyn: Menopausal.  No vaginal bleeding Heme: Other than the rectal bleeding, denies unusual or excessive bleeding/bruising. Transfusions: Per HPI. Neuro:  No headaches, no peripheral tingling or numbness.  Dizziness at times.  No syncope. Derm:  No itching, no rash or sores.  Endocrine:  No sweats or chills.  No polyuria or dysuria Immunization: Vaccinated at least twice for COVID-19 with [employer] vaccine. Travel:  None beyond local counties in last few months.    PHYSICAL EXAM: Vital signs in last 24 hours: Vitals:   05/31/21 0003 05/31/21 0402  BP: (!) 146/83 (!) 168/91  Pulse: 70 75  Resp: 17 15  Temp: 98 F (36.7 C) 98.3 F (36.8 C)  SpO2: 100% 100%   Wt Readings from Last 3 Encounters:  05/28/20 74.8 kg    General: Pleasant, comfortable, non-ill-appearing. Head: No facial asymmetry or swelling. Eyes: Conjunctiva pale.  No scleral icterus. Ears: No hearing deficit. Nose: No discharge or congestion Mouth: Oropharynx dry.  No lesions.  Good dentition. Neck: No JVD, no masses, no  thyromegaly Lungs: Clear bilaterally.  No labored breathing or cough. Heart: RRR.  No MRG.  S1, S2 present Abdomen: Soft without tenderness.  No mass, HSM, bruits, hernias.  Bowel sounds active..   Rectal: Large external hemorrhoids.  No visible blood but proximal end of hemorrhoids erythematous as it emerges from the rectum.  Did not perform DRE as patient was quite uncomfortable with the exam. Musc/Skeltl: No joint redness, swelling or gross deformity. Extremities: No CCE. Neurologic: Oriented x3.  Moves all 4 limbs without gross weakness.  No tremors. Skin: No suspicious lesions.  No rash.  No telangiectasia Nodes: No cervical adenopathy Psych: Calm.  Flat affect.  Cooperative.  Intake/Output from previous day: 07/27 0701 - 07/28 0700 In: 240 [P.O.:240] Out: -  Intake/Output this shift: No intake/output data recorded.  LAB RESULTS: Recent Labs    05/29/21 1748 05/30/21 0825 05/30/21 1534  WBC 6.0 5.8 6.9  HGB 4.1* 7.2* 8.1*  HCT 16.5* 24.7* 27.7*  PLT 321 263 264   BMET Lab Results  Component Value Date   NA 137 05/29/2021   NA 140 05/27/2020   K 3.3 (L) 05/29/2021   K 3.6 05/27/2020   CL 102 05/29/2021   CL 109 05/27/2020   CO2 26 05/29/2021   CO2 22 05/27/2020   GLUCOSE 116 (H) 05/29/2021   GLUCOSE 109 (H) 05/27/2020   BUN 5 (L) 05/29/2021   BUN 9 05/27/2020   CREATININE 0.72 05/29/2021   CREATININE 0.85 05/27/2020   CALCIUM 9.0 05/29/2021   CALCIUM 9.2 05/27/2020   LFT Recent Labs    05/29/21 1748  PROT 6.8  ALBUMIN 3.8  AST 12*  ALT 10  ALKPHOS 34*  BILITOT 0.9   PT/INR No results found for: INR, PROTIME Hepatitis Panel No results for input(s): HEPBSAG, HCVAB, HEPAIGM, HEPBIGM in the last 72 hours. C-Diff No components found for: CDIFF Lipase  No results found for: LIPASE  Drugs of Abuse  No results found for: LABOPIA, COCAINSCRNUR, LABBENZ, AMPHETMU, THCU, LABBARB   RADIOLOGY STUDIES: DG Chest Port 1 View  Result Date:  05/29/2021 CLINICAL DATA:  Weakness and syncope. EXAM: PORTABLE CHEST 1 VIEW COMPARISON:  February 10, 2009 FINDINGS: The heart size and mediastinal contours are within normal limits. Both lungs are clear. The visualized skeletal structures are unremarkable. IMPRESSION: No active cardiopulmonary disease. Electronically Signed   By: Aram Candela M.D.   On: 05/29/2021 21:51     IMPRESSION:      Bleeding per rectum/hematochezia.  Has visible large external hemorrhoids.  Has never had colonoscopy.  Bleeding could be coming from the hemorrhoids or from neoplasm, AVMs, stercoral ulcers as she has chronic constipation.     Chronic constipation worse in recent months.     Iron deficiency anemia.  S/p 2 PRBCs.  Parenteral iron infusion ordered for today.    PLAN:        As she is now experiencing lower abdominal pain and nausea without vomiting, ordered KUB to rule out obstruction.  If KUB is benign/nonobstructive, will set patient up for colonoscopy tomorrow.  Begin clear liquids.  Discontinued the fiber supplement, in some situations where the patient is not taking adequate p.o., fiber can actually worsen the constipation.   Jennye Moccasin  05/31/2021, 8:10 AM Phone [phone]  .  Attending physician's note   I have taken an interval history, reviewed the chart and examined the patient. I agree with the Advanced Practitioner's note, impression and recommendations.   IDA with rectal bleeding. 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 Will decide tomorrow after colon. -D/W pt and daughter Lesly Rubenstein [phone])   Discussed risks & benefits. Risks including rare perforation req laparotomy, bleeding after bx/polypectomy req blood transfusion, rarely missing neoplasms, risks of anesthesia/sedation, rare risk of damage to internal organs. Benefits outweigh the risks. Patient agrees to proceed. All the questions were  answered. Pt consents to proceed.  Raj Shanyiah Conde, MD [facility] [phone]     

## 2021-05-31 NOTE — Progress Notes (Signed)
   Subjective:  Julie Dyer is a 60 y/o female with internal and external hemorrhoids admitted for significant anemia and GI bleeding.  Julie Dyer Hgb dropped from 8.1 last night to 6.9 this AM. Julie Dyer notes that she is feeling very weak and has been feeling nauseous. She moved her bowels once yesterday afternoon which had blood. She then felt urgency to move her bowels this AM but wasn't able to move her bowels. She instead saw more red blood appear in the toilet.  She notes that her rectal pain was 9/10 last night before she received 0.5mg  Dilaudid which has brought her pain to a 5/10.  General surgery saw the pt yesterday and did not feel that her significant anemia was solely attributable to hemorrhoidal bleeding and noted thrombosed, non-bleeding hemorrhoids on exam. Felt GI consult more appropriate.  In further review of bowel habits, Julie Dyer notes that she has had pencil-thin stools for the last week and has moved her bowels q4-5 days. Previously had large caliber stools in setting of chronic constipation with bms q3 days. Last menstrual period in 2008-2010 without recurrent vaginal bleeding since.  Objective:  Vital signs in last 24 hours: Vitals:   05/30/21 1806 05/30/21 2020 05/31/21 0003 05/31/21 0402  BP: 124/86 114/64 (!) 146/83 (!) 168/91  Pulse: 85 73 70 75  Resp: 16 14 17 15   Temp: 98 F (36.7 C) 98.1 F (36.7 C) 98 F (36.7 C) 98.3 F (36.8 C)  TempSrc: Oral Oral Oral Oral  SpO2: 100% 100% 100% 100%   Weight change:   Intake/Output Summary (Last 24 hours) at 05/31/2021 1126 Last data filed at 05/30/2021 2040 Gross per 24 hour  Intake 240 ml  Output --  Net 240 ml   General: Very kind and pleasant, tired-appearing woman laying in bed. No acute distress. Head: Normocephalic. Atraumatic. CV: RRR. No murmurs, rubs, or gallops. No LE edema Pulmonary: Lungs CTAB. Normal effort. No wheezing or rales. Abdominal: Soft, nontender, nondistended. Normal bowel  sounds. Extremities: Palpable radial and DP pulses. Normal ROM. Skin: Warm and dry. No obvious rash or lesions. Neuro: A&Ox3. Moves all extremities. Normal sensation. No focal deficit. Psych: Normal mood and affect    Assessment/Plan:  Active Problems:   Symptomatic anemia  Bleeding hemorrhoids with concern of more proximal GI bleed Hgb decreased from 8.1 yesterday afternoon to 6.8 this am. Reports of significant rectal pain with constipation despite poly-laxative use. Gen Surg felt anemia not solely attributable to hemorrhoids and noted thrombosed external hemorrhoids on exam. In setting of continued blood loss, recent increased constipation from baseline, and pencil-thin stools, consulted GI for inpatient colonoscopy consideration. Pt without hx of prior colonoscopy. -Hydrocortison rectal cream BID -Dibucain prn -Mirilax prn -625mg  Fibercon BID -Senna S, two tablets daily -PO or rectal 650mg  Tylenol q6  Iron deficiency anemia Secondary to presently ongoing blood loss. Currently symptomatic. Presented w/ Hgb at 4.1, up to 8.1 s/p 2 units PRBCs. Decreased down to 6.8. Ferritin severely low at 3 with serum iron at 4 and 3% sat ratio. -Ordered IV Iron -One unit PRBCs  Prior to admission living arrangement: home Anticipated discharge location: home Barriers to discharge: Persistent GI bleeding Dispo: 1-2 days    LOS: 1 day   2041, Medical Student 05/31/2021, 11:26 AM

## 2021-05-31 NOTE — TOC Initial Note (Signed)
Transition of Care [facility]) - Initial/Assessment Note    Patient Details  Name: Julie Dyer MRN: [id number] Date of Birth: 1961-02-26  [facility] CM/SW Contact:    Lockie Pares, RN Phone Number: 05/31/2021, 3:51 PM  Clinical Narrative:                 Patient in with GI bleed, Internal and external Hemmhroid issue, severe. Patient was unable to follow up with gastroenterology, does not have PCP, last time she was here. Recommend [facility] of PCP at [facility], as [facility] not taking new patients at this time. They will work with patient on finacials. Also provided Healthcare.gov website to obtain insurance, patient is employed. GI has been consulted and hopefully can intervene this admission. Sent to [facility] for their assistance.   Expected Discharge Plan: Home/Self Care Barriers to Discharge: Inadequate or no insurance   Patient Goals and CMS Choice        Expected Discharge Plan and Services Expected Discharge Plan: Home/Self Care       Living arrangements for the past 2 months: Apartment                                      Prior Living Arrangements/Services Living arrangements for the past 2 months: Apartment Lives with:: Significant Other Patient language and need for interpreter reviewed:: Yes        Need for Family Participation in Patient Care: Yes (Comment) Care giver support system in place?: Yes (comment)   Criminal Activity/Legal Involvement Pertinent to Current Situation/Hospitalization: No - Comment as needed  Activities of Daily Living      Permission Sought/Granted                  Emotional Assessment       Orientation: : Oriented to Self, Oriented to Place, Oriented to  Time, Oriented to Situation   Psych Involvement: No (comment)  Admission diagnosis:  Syncope and collapse [R55] Symptomatic anemia [D64.9] Hemorrhoids, unspecified hemorrhoid type [K64.9] Anemia, unspecified type [D64.9] Patient Active Problem List    Diagnosis Date Noted   Constipation    Anemia 05/29/2021   PCP:  Patient, No Pcp Per (Inactive) Pharmacy:   [id number] - [place], [place] - [address] AT [address] [place] [address] Phone: [phone] Fax: [phone]     Social Determinants of Health (SDOH) Interventions    Readmission Risk Interventions No flowsheet data found.

## 2021-05-31 NOTE — Progress Notes (Signed)
Date and time results received: 05/31/21 0940 (use smartphrase ".now" to insert current time)  Test: HGB Critical Value: 6.9  Name of Provider Notified: Internal Medicine  Orders Received? Or Actions Taken?: Awaiting orders and further instruction.

## 2021-05-31 NOTE — H&P (View-Only) (Signed)
[facility] Consult: 8:10 AM 05/31/2021  LOS: 1 day    Referring Provider: Dr Criselda Peaches  Primary Care Physician:  Patient, No Pcp Per (Inactive) Primary Gastroenterologist: none.  unassigned     Reason for Consultation: Hematochezia.  Hemorrhoids.   HPI: Julie Dyer is a 60 y.o. female.  PMH glaucoma.  Anemia.   Presented to ED 2 days ago.  About a week prior she developed hematochezia.  Some associated rectoanal pain.  Episodes hematochezia occur with and without brown stool.  Occurring 3 or 4 times a day.  Patient has a many year history of chronic constipation moving her bowels 2-3 times per week.  In recent months she moves her bowels every 4 days.  Uses MiraLAX periodically and more recently has been using this once daily.  About a year ago she had rectal bleeding attributed to hemorrhoids.  Was never seen by a GI specialist. Presented to ED and admitted 2 days ago.  Bleeding seemed to subside initially but was aggressive yesterday.  Has developed discomfort in the lower abdomen that is worse when she is about to pass blood or stool.  Continues to have pain in the anus on defecation.  Initial blood pressures in the high 90s/60s but more recently normal or hypertensive.  No tachycardia.  Excellent oxygen sats.   On rectal exam has large hemorrhoids. Hgb 8.6 >> 4.1 >> 2 PRBCs >> 7.2.  MCV 65.  BUN normal.  Iron level 14, ferritin 3.  Low iron sats.  Patient initiated on MiraLAX yesterday.  Also receiving daily Senokot, bid Colace, fibercon, topcal Anusol and dibucaine.  Ferrlecit infusion is ordered.  This morning she is nauseated.  This was not an issue PTA but patient had adjusted her food intake mostly to yogurt and applesauce thinking that soft foods would help her move her bowels.  No alcohol, no  cigarettes.  Unemployed, previously worked at the [employer]. Family history negative for blood loss.  Anemia.  Colorectal cancer  Past Medical History:  Diagnosis Date   Glaucoma     No past surgical history on file.  Prior to Admission medications   Medication Sig Start Date End Date Taking? Authorizing Provider  brimonidine (ALPHAGAN) 0.2 % ophthalmic solution Place 1 drop into the left eye 2 (two) times daily. 03/05/21  Yes [provider]  dorzolamide-timolol (COSOPT) 22.3-6.8 MG/ML ophthalmic solution Place 1 drop into the left eye 2 (two) times daily. 05/12/20  Yes [provider]  polyethylene glycol (MIRALAX / GLYCOLAX) 17 g packet Take 17 g by mouth daily.   Yes [provider]  psyllium (METAMUCIL SMOOTH TEXTURE) 28 % packet Take 1 packet by mouth daily.   Yes [provider]  VYZULTA 0.024 % SOLN Place 1 drop into the left eye every evening. 04/27/20  Yes [provider]  hydrocortisone (ANUSOL-HC) 25 MG suppository Place 1 suppository (25 mg total) rectally 2 (two) times daily. For 7 days Patient not taking: No sig reported 05/28/20   Zadie Rhine, MD    Scheduled Meds:  brimonidine  1 drop Left Eye BID   docusate sodium  100 mg Oral BID   dorzolamide-timolol  1 drop Left Eye BID   hydrocortisone   Rectal BID   latanoprost  1 drop Left Eye QHS   polycarbophil  625 mg Oral BID   polyethylene glycol  17 g Oral Daily   senna-docusate  2 tablet Oral Daily   Infusions:  sodium chloride Stopped (05/29/21 2050)   ferric gluconate (FERRLECIT) IVPB     PRN Meds: acetaminophen **OR** acetaminophen, dibucaine, ondansetron (ZOFRAN) IV   Allergies as of 05/29/2021   (No Known Allergies)    Family History  Problem Relation Age of Onset   Diabetes Mother    Alcohol abuse Mother    Alcohol abuse Father     Social History   Socioeconomic History   Marital status: Single    Spouse name: Not on file   Number of children:  Not on file   Years of education: Not on file   Highest education level: Not on file  Occupational History   Not on file  Tobacco Use   Smoking status: Never   Smokeless tobacco: Never  Substance and Sexual Activity   Alcohol use: Not Currently   Drug use: Not Currently   Sexual activity: Not on file  Other Topics Concern   Not on file  Social History Narrative   Not on file   Social Determinants of Health   Financial Resource Strain: Not on file  Food Insecurity: Not on file  Transportation Needs: Not on file  Physical Activity: Not on file  Stress: Not on file  Social Connections: Not on file  Intimate Partner Violence: Not on file    REVIEW OF SYSTEMS: Constitutional: Feeling weak. ENT:  No nose bleeds Pulm: No shortness of breath.  No cough. CV:  No palpitations, no LE edema.  No angina. GU:  No hematuria, no frequency GI: See HPI.  No dysphagia.  No heartburn. Gyn: Menopausal.  No vaginal bleeding Heme: Other than the rectal bleeding, denies unusual or excessive bleeding/bruising. Transfusions: Per HPI. Neuro:  No headaches, no peripheral tingling or numbness.  Dizziness at times.  No syncope. Derm:  No itching, no rash or sores.  Endocrine:  No sweats or chills.  No polyuria or dysuria Immunization: Vaccinated at least twice for COVID-19 with [employer] vaccine. Travel:  None beyond local counties in last few months.    PHYSICAL EXAM: Vital signs in last 24 hours: Vitals:   05/31/21 0003 05/31/21 0402  BP: (!) 146/83 (!) 168/91  Pulse: 70 75  Resp: 17 15  Temp: 98 F (36.7 C) 98.3 F (36.8 C)  SpO2: 100% 100%   Wt Readings from Last 3 Encounters:  05/28/20 74.8 kg    General: Pleasant, comfortable, non-ill-appearing. Head: No facial asymmetry or swelling. Eyes: Conjunctiva pale.  No scleral icterus. Ears: No hearing deficit. Nose: No discharge or congestion Mouth: Oropharynx dry.  No lesions.  Good dentition. Neck: No JVD, no masses, no  thyromegaly Lungs: Clear bilaterally.  No labored breathing or cough. Heart: RRR.  No MRG.  S1, S2 present Abdomen: Soft without tenderness.  No mass, HSM, bruits, hernias.  Bowel sounds active..   Rectal: Large external hemorrhoids.  No visible blood but proximal end of hemorrhoids erythematous as it emerges from the rectum.  Did not perform DRE as patient was quite uncomfortable with the exam. Musc/Skeltl: No joint redness, swelling or gross deformity. Extremities: No CCE. Neurologic: Oriented x3.  Moves all 4 limbs without gross weakness.  No tremors. Skin: No suspicious lesions.  No rash.  No telangiectasia Nodes: No cervical adenopathy Psych: Calm.  Flat affect.  Cooperative.  Intake/Output from previous day: 07/27 0701 - 07/28 0700 In: 240 [P.O.:240] Out: -  Intake/Output this shift: No intake/output data recorded.  LAB RESULTS: Recent Labs    05/29/21 1748 05/30/21 0825 05/30/21 1534  WBC 6.0 5.8 6.9  HGB 4.1* 7.2* 8.1*  HCT 16.5* 24.7* 27.7*  PLT 321 263 264   BMET Lab Results  Component Value Date   NA 137 05/29/2021   NA 140 05/27/2020   K 3.3 (L) 05/29/2021   K 3.6 05/27/2020   CL 102 05/29/2021   CL 109 05/27/2020   CO2 26 05/29/2021   CO2 22 05/27/2020   GLUCOSE 116 (H) 05/29/2021   GLUCOSE 109 (H) 05/27/2020   BUN 5 (L) 05/29/2021   BUN 9 05/27/2020   CREATININE 0.72 05/29/2021   CREATININE 0.85 05/27/2020   CALCIUM 9.0 05/29/2021   CALCIUM 9.2 05/27/2020   LFT Recent Labs    05/29/21 1748  PROT 6.8  ALBUMIN 3.8  AST 12*  ALT 10  ALKPHOS 34*  BILITOT 0.9   PT/INR No results found for: INR, PROTIME Hepatitis Panel No results for input(s): HEPBSAG, HCVAB, HEPAIGM, HEPBIGM in the last 72 hours. C-Diff No components found for: CDIFF Lipase  No results found for: LIPASE  Drugs of Abuse  No results found for: LABOPIA, COCAINSCRNUR, LABBENZ, AMPHETMU, THCU, LABBARB   RADIOLOGY STUDIES: DG Chest Port 1 View  Result Date:  05/29/2021 CLINICAL DATA:  Weakness and syncope. EXAM: PORTABLE CHEST 1 VIEW COMPARISON:  February 10, 2009 FINDINGS: The heart size and mediastinal contours are within normal limits. Both lungs are clear. The visualized skeletal structures are unremarkable. IMPRESSION: No active cardiopulmonary disease. Electronically Signed   By: Aram Candela M.D.   On: 05/29/2021 21:51     IMPRESSION:      Bleeding per rectum/hematochezia.  Has visible large external hemorrhoids.  Has never had colonoscopy.  Bleeding could be coming from the hemorrhoids or from neoplasm, AVMs, stercoral ulcers as she has chronic constipation.     Chronic constipation worse in recent months.     Iron deficiency anemia.  S/p 2 PRBCs.  Parenteral iron infusion ordered for today.    PLAN:        As she is now experiencing lower abdominal pain and nausea without vomiting, ordered KUB to rule out obstruction.  If KUB is benign/nonobstructive, will set patient up for colonoscopy tomorrow.  Begin clear liquids.  Discontinued the fiber supplement, in some situations where the patient is not taking adequate p.o., fiber can actually worsen the constipation.   Jennye Moccasin  05/31/2021, 8:10 AM Phone [phone]  .  Attending physician's note   I have taken an interval history, reviewed the chart and examined the patient. I agree with the Advanced Practitioner's note, impression and recommendations.   IDA with rectal bleeding. 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 Will decide tomorrow after colon. -D/W pt and daughter Lesly Rubenstein [phone])   Discussed risks & benefits. Risks including rare perforation req laparotomy, bleeding after bx/polypectomy req blood transfusion, rarely missing neoplasms, risks of anesthesia/sedation, rare risk of damage to internal organs. Benefits outweigh the risks. Patient agrees to proceed. All the questions were  answered. Pt consents to proceed.  Edman Circleaj Jerad Dunlap, MD [facility] [phone]

## 2021-06-01 ENCOUNTER — Inpatient Hospital Stay (HOSPITAL_COMMUNITY): Payer: Self-pay | Admitting: Certified Registered"

## 2021-06-01 ENCOUNTER — Other Ambulatory Visit: Payer: Self-pay

## 2021-06-01 ENCOUNTER — Encounter (HOSPITAL_COMMUNITY)
Admission: EM | Disposition: A | Discharge status: Home / Self Care | Payer: Self-pay | Source: Home / Self Care | Attending: Internal Medicine

## 2021-06-01 ENCOUNTER — Encounter (HOSPITAL_COMMUNITY): Payer: Self-pay | Admitting: Internal Medicine

## 2021-06-01 DIAGNOSIS — K5900 Constipation, unspecified: Secondary | ICD-10-CM

## 2021-06-01 DIAGNOSIS — K649 Unspecified hemorrhoids: Secondary | ICD-10-CM

## 2021-06-01 DIAGNOSIS — K625 Hemorrhage of anus and rectum: Secondary | ICD-10-CM

## 2021-06-01 HISTORY — PX: COLONOSCOPY WITH PROPOFOL: SHX5780

## 2021-06-01 LAB — BASIC METABOLIC PANEL
Anion gap: 9 (ref 5–15)
BUN: <5 mg/dL — ABNORMAL LOW (ref 6–20)
CO2: 23 mmol/L (ref 22–32)
Calcium: 9.2 mg/dL (ref 8.9–10.3)
Chloride: 109 mmol/L (ref 98–111)
Creatinine, Ser: 0.74 mg/dL (ref 0.44–1.00)
GFR, Estimated: >60 mL/min (ref >60)
Glucose, Bld: 101 mg/dL — ABNORMAL HIGH (ref 70–99)
Potassium: 3.4 mmol/L — ABNORMAL LOW (ref 3.5–5.1)
Sodium: 141 mmol/L (ref 135–145)

## 2021-06-01 LAB — BPAM RBC
Blood Product Expiration Date: 202208282359
Blood Product Expiration Date: 202208282359
Blood Product Expiration Date: 202208302359
ISSUE DATE / TIME: 202207262325
ISSUE DATE / TIME: 202207270344
ISSUE DATE / TIME: 202207281123
Unit Type and Rh: 5100
Unit Type and Rh: 5100
Unit Type and Rh: 5100

## 2021-06-01 LAB — CBC
HCT: 30.1 % — ABNORMAL LOW (ref 36.0–46.0)
Hemoglobin: 9 g/dL — ABNORMAL LOW (ref 12.0–15.0)
MCH: 22.8 pg — ABNORMAL LOW (ref 26.0–34.0)
MCHC: 29.9 g/dL — ABNORMAL LOW (ref 30.0–36.0)
MCV: 76.2 fL — ABNORMAL LOW (ref 80.0–100.0)
Platelets: 286 10*3/uL (ref 150–400)
RBC: 3.95 MIL/uL (ref 3.87–5.11)
RDW: 25.3 % — ABNORMAL HIGH (ref 11.5–15.5)
WBC: 8.5 10*3/uL (ref 4.0–10.5)
nRBC: 0.8 % — ABNORMAL HIGH (ref 0.0–0.2)

## 2021-06-01 LAB — TYPE AND SCREEN
ABO/RH(D): O POS
Antibody Screen: NEGATIVE
Unit division: 0
Unit division: 0
Unit division: 0

## 2021-06-01 LAB — HEMOGLOBIN AND HEMATOCRIT, BLOOD
HCT: 29.5 % — ABNORMAL LOW (ref 36.0–46.0)
Hemoglobin: 8.5 g/dL — ABNORMAL LOW (ref 12.0–15.0)

## 2021-06-01 SURGERY — COLONOSCOPY WITH PROPOFOL
Anesthesia: Monitor Anesthesia Care

## 2021-06-01 MED ORDER — POTASSIUM CHLORIDE CRYS ER 20 MEQ PO TBCR
40.0000 meq | EXTENDED_RELEASE_TABLET | Freq: Once | ORAL | Status: AC
  Administered 2021-06-01: 40 meq via ORAL
  Filled 2021-06-01: qty 2

## 2021-06-01 MED ORDER — POLYETHYLENE GLYCOL 3350 17 G PO PACK: 17.0000 g | PACK | Freq: Two times a day (BID) | ORAL | 0 refills | Status: AC

## 2021-06-01 MED ORDER — LACTATED RINGERS IV SOLN: INTRAVENOUS | Status: DC | PRN

## 2021-06-01 MED ORDER — DIBUCAINE (PERIANAL) 1 % EX OINT: 1.0000 "application " | TOPICAL_OINTMENT | CUTANEOUS | 0 refills | Status: DC | PRN

## 2021-06-01 MED ORDER — SENNOSIDES-DOCUSATE SODIUM 8.6-50 MG PO TABS: 2.0000 | ORAL_TABLET | Freq: Every day | ORAL | 0 refills | Status: AC

## 2021-06-01 MED ORDER — DOCUSATE SODIUM 100 MG PO CAPS: 100.0000 mg | ORAL_CAPSULE | Freq: Two times a day (BID) | ORAL | 0 refills | Status: AC

## 2021-06-01 MED ORDER — LIDOCAINE 2% (20 MG/ML) 5 ML SYRINGE
INTRAMUSCULAR | Status: DC | PRN
  Administered 2021-06-01: 60 mg via INTRAVENOUS

## 2021-06-01 MED ORDER — POLYETHYLENE GLYCOL 3350 17 G PO PACK: 17.0000 g | PACK | Freq: Two times a day (BID) | ORAL | Status: DC

## 2021-06-01 MED ORDER — PROPOFOL 500 MG/50ML IV EMUL
INTRAVENOUS | Status: DC | PRN
  Administered 2021-06-01: 125 ug/kg/min via INTRAVENOUS

## 2021-06-01 MED ORDER — HYDROCORT-PRAMOXINE (PERIANAL) 1-1 % EX FOAM: 1.0000 | Freq: Three times a day (TID) | CUTANEOUS | 0 refills | Status: AC

## 2021-06-01 MED ORDER — HYDROCORTISONE ACETATE 25 MG RE SUPP: 25.0000 mg | Freq: Two times a day (BID) | RECTAL | 0 refills | Status: DC | PRN

## 2021-06-01 MED ORDER — HYDROCORTISONE (PERIANAL) 2.5 % EX CREA: 1.0000 "application " | TOPICAL_CREAM | Freq: Two times a day (BID) | CUTANEOUS | 0 refills | Status: DC

## 2021-06-01 MED ORDER — HYDROCORT-PRAMOXINE (PERIANAL) 1-1 % EX FOAM
1.0000 | Freq: Three times a day (TID) | CUTANEOUS | Status: DC
  Filled 2021-06-01: qty 10

## 2021-06-01 SURGICAL SUPPLY — 22 items

## 2021-06-01 NOTE — Anesthesia Preprocedure Evaluation (Signed)
Anesthesia Evaluation  Patient identified by MRN, date of birth, ID band Patient awake    Reviewed: Allergy & Precautions, NPO status , Patient's Chart, lab work & pertinent test results  Airway Mallampati: II  TM Distance: >3 FB     Dental   Pulmonary neg pulmonary ROS,    breath sounds clear to auscultation       Cardiovascular negative cardio ROS   Rhythm:Regular Rate:Normal     Neuro/Psych    GI/Hepatic Neg liver ROS, History noted. Dr. Chilton Si   Endo/Other  negative endocrine ROS  Renal/GU negative Renal ROS     Musculoskeletal   Abdominal   Peds  Hematology   Anesthesia Other Findings   Reproductive/Obstetrics                             Anesthesia Physical Anesthesia Plan  ASA: 3  Anesthesia Plan:    Post-op Pain Management:    Induction: Intravenous  PONV Risk Score and Plan: 2 and Propofol infusion  Airway Management Planned: Nasal Cannula and Simple Face Mask  Additional Equipment:   Intra-op Plan:   Post-operative Plan:   Informed Consent: I have reviewed the patients History and Physical, chart, labs and discussed the procedure including the risks, benefits and alternatives for the proposed anesthesia with the patient or authorized representative who has indicated his/her understanding and acceptance.     Dental advisory given  Plan Discussed with: CRNA and Anesthesiologist  Anesthesia Plan Comments:         Anesthesia Quick Evaluation

## 2021-06-01 NOTE — Discharge Summary (Signed)
Name: Julie Dyer MRN: [id number] DOB: 1961/10/21 60 y.o. PCP: Patient, No Pcp Per (Inactive)  Date of Admission: 05/29/2021  5:43 PM Date of Discharge: 06/01/2021 Attending Physician: Inez Catalina, MD  Discharge Diagnosis: External and Internal Hemorrhoids Iron deficiency anemia Constipation  Discharge Medications: Allergies as of 06/01/2021   No Known Allergies      Medication List     STOP taking these medications    hydrocortisone 25 MG suppository Commonly known as: ANUSOL-HC       TAKE these medications    brimonidine 0.2 % ophthalmic solution Commonly known as: ALPHAGAN Place 1 drop into the left eye 2 (two) times daily.   dibucaine 1 % Oint Commonly known as: NUPERCAINAL Place 1 application rectally as needed for hemorrhoids (As needed for pain.  (no more than 30 grams of 1% ointment per day)).   docusate sodium 100 MG capsule Commonly known as: COLACE Take 1 capsule (100 mg total) by mouth 2 (two) times daily.   dorzolamide-timolol 22.3-6.8 MG/ML ophthalmic solution Commonly known as: COSOPT Place 1 drop into the left eye 2 (two) times daily.   hydrocortisone 2.5 % rectal cream Commonly known as: Anusol-HC Place 1 application rectally 2 (two) times daily.   hydrocortisone-pramoxine rectal foam Commonly known as: PROCTOFOAM-HC Place 1 applicator rectally 3 (three) times daily for 14 days.   polyethylene glycol 17 g packet Commonly known as: MIRALAX / GLYCOLAX Take 17 g by mouth 2 (two) times daily. What changed: when to take this   psyllium 28 % packet Commonly known as: METAMUCIL SMOOTH TEXTURE Take 1 packet by mouth daily.   senna-docusate 8.6-50 MG tablet Commonly known as: Senokot-S Take 2 tablets by mouth daily. Start taking on: June 02, 2021   Vyzulta 0.024 % Soln Generic drug: Latanoprostene Bunod Place 1 drop into the left eye every evening.        Disposition and follow-up:   Ms.Julie Dyer was discharged from [facility] in Good condition.  At the hospital follow up visit please address:  1.  Evaluate bowel habits, constipation and any improvement therein. Inquire about continued blood loss with bowel movements. Pt has opportunity to f/u with [facility] if conservative management does not prove effective.  2.  Labs / imaging needed at time of follow-up: Repeat CBC and Ferritin. Can also consider PO Iron replacement (pt had IV Iron in hospital).  3.  Pending labs/ test needing follow-up: none  Follow-up Appointments:  Follow-up Information     Primary Care at [facility]. Schedule an appointment as soon as possible for a visit.   Specialty: Family Medicine Why: to establish primary care doctor Contact information: [address] [place] [address] [phone]        Healthcare.gov Follow up.   Why: obtain insurance through healthcare .gov, have different plans and pricing. Contact information: [email or web address]        [facility]. Schedule an appointment as soon as possible for a visit in 1 week(s).   Contact information: [address] [place] [address] [phone]        [facility]. Schedule an appointment as soon as possible for a visit in 1 week(s).   Contact information: [address] [place] [address]                Hospital Course by problem list:   Ms. Julie Dyer is a  60 year old woman with PMH of hemorrhoids and glaucoma who presented with hematochezia and symptomatic anemia.  Hgb was 4.1 and she was initiated on PRBC transfusions.     Bleeding hemorrhoids Hgb at 4.1 on admission up to 8.1 on 7/27 s/p 2 units PRBCs, followed by repeated hemorrhoidal bleeding and Hgb down to 6.9 with nausea and weakness. Third unit PRBC given with Hgb now at 9.0. General surgery consulted with rec to pursue GI w/u.  Consulted GI s/p Colonoscopy only showing large prolapsed hemorrhoids and GI rec to see General Surgery again. General Surgery saw pt and after discussion, pt oped to pursue conservative management first. Previously had similar episode last July for which she was referred to specialist but unable to keep appointment because she did not have insurance coverage.  -Toilet hygiene, avoid prolonged toilet sitting -Proctofoam TID -Mirilax BID -2 tablets Senna S daily -Sitz baths q 4hrs -Anusol external cream -PO or Rectal 650mg  Tylenol q6 -Able to see Colorectal surgeons at [facility] in f/u    Iron deficiency anemia Secondary to repeated hemorrhoidal blood loss. Ferritin very low at 3 with serum iron at 4. Pt now s/p IV Iron and 3 units PrBCs. Demonstrated stable Hgb since 7/28 am.   Discharge Exam:   BP (!) 151/87   Pulse (!) 57   Temp 97.9 F (36.6 C) (Temporal)   Resp 17   SpO2 100%  Discharge exam:  General: Pleasant, well-appearing woman laying comfortably in bed. No acute distress. Head: Normocephalic. Atraumatic. CV: RRR. No murmurs, rubs, or gallops. No LE edema Pulmonary: Lungs CTAB. Normal effort. No wheezing or rales. Abdominal: Soft, nontender, nondistended. Normal bowel sounds. Extremities: Palpable radial and DP pulses. Normal ROM. Skin: Warm and dry. No obvious rash or lesions. Neuro: A&Ox3. Moves all extremities. Normal sensation. No focal deficit. Psych: Normal mood and affect  Pertinent Labs, Studies, and Procedures:  DG Chest Port 1 View  Result Date: 05/29/2021 CLINICAL DATA:  Weakness and syncope. EXAM: PORTABLE CHEST 1 VIEW COMPARISON:  February 10, 2009 FINDINGS: The heart size and mediastinal contours are within normal limits. Both lungs are clear. The visualized skeletal structures are unremarkable. IMPRESSION: No active cardiopulmonary disease. Electronically Signed   By: February 12, 2009 M.D.   On: 05/29/2021 21:51     Discharge  Instructions: Discharge Instructions     Call MD for:  difficulty breathing, headache or visual disturbances   Complete by: As directed    Call MD for:  persistant dizziness or light-headedness   Complete by: As directed       Continue to address your constipation with increased water intake, stool softeners and fiber.  Continue MiraLAX and Senokot twice daily as well as Colace.  I have also sent in a prescription for lidocaine cream for rectal pain.  Please use this as needed.  If you notice you are having frequent bowel movements you can decrease these supplements to once daily.  Encourage good toilet hygiene and avoid prolonged sitting on the toilet.  You can continue sitz bath's every 4 hours as needed.  I have sent in a prescription for Anusol external rectal cream as well as Proctofoam for your internal hemorrhoids-use this 3 times daily for the next 2 weeks.  If your symptoms persist and/or worsen with this conservative management we will have you follow-up with colorectal surgery for further care and treatment.  Signed07/28/2022, DO 06/01/2021, 12:26 PM

## 2021-06-01 NOTE — Discharge Instructions (Signed)
Hospitalized due to your severe anemia secondary to your hemorrhoids.  Your colonoscopy was normal except for the significant hemorrhoids.  General surgery discussed conservative versus surgical management.  Please see the below recommendations.  Continue to address your constipation with increased water intake, stool softeners and fiber.  Continue MiraLAX and Senokot twice daily as well as Colace.  If you notice you are having frequent bowel movements you can decrease these supplements to once daily.  Encourage good toilet hygiene and avoid prolonged sitting on the toilet.  You can continue sitz bath's every 4 hours as needed.  I have sent in a prescription for Anusol external rectal cream as well as Proctofoam for your internal hemorrhoids-use this 3 times daily for the next 2 weeks.  If your symptoms persist and/or worsen with this conservative management we will have you follow-up with colorectal surgery for further care and treatment.

## 2021-06-01 NOTE — Transfer of Care (Signed)
Immediate Anesthesia Transfer of Care Note  Patient: Julie Dyer  Procedure(s) Performed: COLONOSCOPY WITH PROPOFOL  Patient Location: PACU  Anesthesia Type:MAC  Level of Consciousness: drowsy  Airway & Oxygen Therapy: Patient Spontanous Breathing and Patient connected to face mask oxygen  Post-op Assessment: Report given to RN and Post -op Vital signs reviewed and stable  Post vital signs: Reviewed and stable  Last Vitals:  Vitals Value Taken Time  BP 94/53 06/01/21 0903  Temp    Pulse 71 06/01/21 0908  Resp 21 06/01/21 0908  SpO2 100 % 06/01/21 0908  Vitals shown include unvalidated device data.  Last Pain:  Vitals:   06/01/21 0800  TempSrc: Temporal  PainSc: 0-No pain      Patients Stated Pain Goal: 3 (73/53/29 9242)  Complications: No notable events documented.

## 2021-06-01 NOTE — Progress Notes (Signed)
   Subjective:  Julie Dyer is a 60 y/o female with internal and external hemorrhoids admitted for significant hemorrhoidal bleeding causing symptomatic anemia.  Julie Dyer underwent a colonoscopy this morning to r/o more proximal causes for her continued rectal bleeding. Her colonoscopy revealed only large, prolapsed hemorrhoids. She has received 3 units of blood and one IV Iron this admission due to repeated blood loss.  Julie Dyer notes that she is feeling tired after her colonoscopy but better than yesterday when her Hgb fell again. She endorses some remaining lightheadedness when she stands up, and denies any headaches.   Objective:  Vital signs in last 24 hours: Vitals:   06/01/21 0800 06/01/21 0907 06/01/21 0915 06/01/21 0925  BP: (!) 168/76 (!) 94/53 117/72 (!) 151/87  Pulse:  73 63 (!) 57  Resp:  15 15 17   Temp: 97.9 F (36.6 C) 97.9 F (36.6 C)    TempSrc: Temporal Temporal    SpO2:  100% 100% 100%   Weight change:   Intake/Output Summary (Last 24 hours) at 06/01/2021 1000 Last data filed at 06/01/2021 0856 Gross per 24 hour  Intake 745 ml  Output --  Net 745 ml   General: Pleasant, well-appearing woman laying comfortably in bed. No acute distress. Head: Normocephalic. Atraumatic. Pale conjunctiva. CV: RRR. No murmurs, rubs, or gallops. No LE edema Pulmonary: Lungs CTAB. Normal effort. No wheezing or rales. Abdominal: Soft, nontender, nondistended. Normal bowel sounds. Extremities: Palpable radial and DP pulses. Normal ROM. Skin: Warm and dry. No obvious rash or lesions. Neuro: A&Ox3. Moves all extremities. Normal sensation. No focal deficit. Psych: Normal mood and affect   Assessment/Plan:  Active Problems:   Anemia   Constipation   Hemorrhoids  Bleeding hemorrhoids Hgb at 4.1 on admission up to 8.1 on 7/27 s/p 2 units PRBCs, followed by repeated hemorrhoidal bleeding and Hgb down to 6.9 with nausea and weakness. Third unit PRBC given with Hgb now at 9.0.  General surgery consulted with rec to pursue GI w/u. Now s/p Colonoscopy, only showing large prolapsed hemorrhoids and GI rec to see General Surgery. -Consulted General Surgery for consideration of inpatient surgery -Hydrocortisone rectal cream BID -Dibucaine prn -Mirilax prn -2 tablets Senna S daily -PO or Rectal 650mg  Tylenol q6  Iron deficiency anemia Secondary to repeated hemorrhoidal blood loss. Currently mildly symptomatic. Ferritin very low at 3 with serum iron at 4. Pt now s/p IV Iron and 3 units PrBCs. -Trending CBCs  Prior to admission living arrangement: home Anticipated discharge location: home Barriers to discharge: repeated episodes of blood loss  Dispo: 1 day   LOS: 2 days   8/27 A, Medical Student 06/01/2021, 10:00 AM

## 2021-06-01 NOTE — Op Note (Signed)
[facility] Patient Name: Julie Dyer Procedure Date : 06/01/2021 MRN: [id number] Attending MD: Lynann Bologna , MD Date of Birth: October 27, 1961 CSN: [id number] Age: 60 Admit Type: Inpatient Procedure:                Colonoscopy Indications:              Rectal bleeding. Adm Hb 4.0 Providers:                Lynann Bologna, MD, Adolph Pollack, RN, Arlee Muslim Tech., Technician Referring MD:              Medicines:                Monitored Anesthesia Care Complications:            No immediate complications. Estimated Blood Loss:     Estimated blood loss: none. Procedure:                Pre-Anesthesia Assessment:                           - Prior to the procedure, a History and Physical                            was performed, and patient medications and                            allergies were reviewed. The patient's tolerance of                            previous anesthesia was also reviewed. The risks                            and benefits of the procedure and the sedation                            options and risks were discussed with the patient.                            All questions were answered, and informed consent                            was obtained. Prior Anticoagulants: The patient has                            taken no previous anticoagulant or antiplatelet                            agents. ASA Grade Assessment: II - A patient with                            mild systemic disease. After reviewing the risks  and benefits, the patient was deemed in                            satisfactory condition to undergo the procedure.                           After obtaining informed consent, the colonoscope                            was passed under direct vision. Throughout the                            procedure, the patient's blood pressure, pulse, and                            oxygen saturations  were monitored continuously. The                            PCF-190TL ([id number]) Olympus colonoscope was                            introduced through the anus and advanced to the 2                            cm into the ileum. The colonoscopy was performed                            without difficulty. The patient tolerated the                            procedure well. The quality of the bowel                            preparation was good. The terminal ileum, ileocecal                            valve, appendiceal orifice, and rectum were                            photographed. Scope In: 8:47:44 AM Scope Out: 8:56:57 AM Scope Withdrawal Time: 0 hours 6 minutes 20 seconds  Total Procedure Duration: 0 hours 9 minutes 13 seconds  Findings:      A few small-mouthed diverticula were found in the sigmoid colon.       Otherwise normal colonoscopy.      The terminal ileum appeared normal.      External and internal hemorrhoids were found during retroflexion and       during perianal exam. The hemorrhoids were severe, large and Grade IV       (internal hemorrhoids that prolapse and cannot be reduced manually).      The exam was otherwise without abnormality on direct and retroflexion       views. Impression:               - Large prolapsed grade 4 hemorrhoids with  ulcerations (see pictures)                           - Mild sigmoid diverticulosis.                           - Otherwise normal colonoscopy to TI.                           - No specimens collected. Recommendation:           - Return patient to hospital ward for ongoing care.                           - Resume previous diet.                           - The hemorrhoids not amenable to medical                            treatment. Would recommend surgical consultation.                            For now can use ProctoFoam-HC: Apply externally TID                            for 2 weeks.                            - Repeat colonoscopy in 10 years. Earlier, if with                            any new problems or change in family history.                           - The findings and recommendations were discussed                            with the patient's family. Procedure Code(s):        --- Professional ---                           [id number], Colonoscopy, flexible; diagnostic, including                            collection of specimen(s) by brushing or washing,                            when performed (separate procedure) Diagnosis Code(s):        --- Professional ---                           U04.5K64.3, Fourth degree hemorrhoids                           K62.5, Hemorrhage of anus and rectum  K57.30, Diverticulosis of large intestine without                            perforation or abscess without bleeding CPT copyright 2019 [employer]. All rights reserved. The codes documented in this report are preliminary and upon coder review may  be revised to meet current compliance requirements. Lynann Bologna, MD 06/01/2021 9:06:09 AM This report has been signed electronically. Number of Addenda: 0

## 2021-06-01 NOTE — Progress Notes (Signed)
Day of Surgery  Subjective: Still with rectal pain.  Despite order hasn't been offered any sitz bathes to try.  Had c-scope today.  No other acute findings besides hemorrhoids.  Still passing some blood but only with bowel movements.  Tolerating CLD.  ROS: See above, otherwise other systems negative  Objective: Vital signs in last 24 hours: Temp:  [97.7 F (36.5 C)-98.4 F (36.9 C)] 97.9 F (36.6 C) (07/29 0907) Pulse Rate:  [57-73] 57 (07/29 0925) Resp:  [15-18] 17 (07/29 0925) BP: (94-177)/(53-88) 151/87 (07/29 0925) SpO2:  [98 %-100 %] 100 % (07/29 0925)    Intake/Output from previous day: 07/28 0701 - 07/29 0700 In: 670 [Blood:400; IV Piggyback:270] Out: -  Intake/Output this shift: Total I/O In: 75 [I.V.:75] Out: -   PE: Rectal: large external hemorrhoids noted.  None thrombosed.  No bleeding noted at this time  Lab Results:  Recent Labs    05/30/21 1534 05/31/21 0840 05/31/21 1652 06/01/21 0444  WBC 6.9  --   --  8.5  HGB 8.1*   < > 8.5* 9.0*  HCT 27.7*   < > 28.8* 30.1*  PLT 264  --   --  286   < > = values in this interval not displayed.   BMET Recent Labs    05/29/21 1748 06/01/21 0444  NA 137 141  K 3.3* 3.4*  CL 102 109  CO2 26 23  GLUCOSE 116* 101*  BUN 5* <5*  CREATININE 0.72 0.74  CALCIUM 9.0 9.2   PT/INR No results for input(s): LABPROT, INR in the last 72 hours. CMP     Component Value Date/Time   NA 141 06/01/2021 0444   K 3.4 (L) 06/01/2021 0444   CL 109 06/01/2021 0444   CO2 23 06/01/2021 0444   GLUCOSE 101 (H) 06/01/2021 0444   BUN <5 (L) 06/01/2021 0444   CREATININE 0.74 06/01/2021 0444   CALCIUM 9.2 06/01/2021 0444   PROT 6.8 05/29/2021 1748   ALBUMIN 3.8 05/29/2021 1748   AST 12 (L) 05/29/2021 1748   ALT 10 05/29/2021 1748   ALKPHOS 34 (L) 05/29/2021 1748   BILITOT 0.9 05/29/2021 1748   GFRNONAA >60 06/01/2021 0444   GFRAA >60 05/27/2020 1709   Lipase  No results found for:  LIPASE     Studies/Results: DG Abd 1 View  Result Date: 05/31/2021 CLINICAL DATA:  Nausea.  Abdominal pain. EXAM: ABDOMEN - 1 VIEW COMPARISON:  No recent prior. FINDINGS: Soft tissue structures are unremarkable. No bowel distention. No free air. No acute bony abnormality. IMPRESSION: No acute abnormality. Electronically Signed   By: Maisie Fus  Register   On: 05/31/2021 10:48    Anti-infectives: Anti-infectives (From admission, onward)    None        Assessment/Plan Internal/external hemorrhoids -continue to address constipation issues with stool softeners.  -increase miralax to BID as this may be required at home as clearly daily wasn't increasing her frequency -good toilet hygiene and avoid prolonged sitting on the toilet -sitz bathes q 4 hrs  -continue anusol external cream.  Will add proctofoam for internal hemorrhoids as well -discussion was had regarding continued conservative management vs surgery.  She would not like to proceed with surgery and continue conservative approach. -patient can DC home once hgb stable -she can see one of our colorectal surgeons in follow up for further care and treatment.  FEN - CLD, adv per primary VTE - on hold due to anemia ID - none needed  LOS: 2 days    Letha Cape , [facility] 06/01/2021, 11:52 AM Please see Amion for pager number during day hours 7:00am-4:30pm or 7:00am -11:30am on weekends

## 2021-06-01 NOTE — Progress Notes (Signed)
RN gave pt discharge instructions and and she stated understanding. IV has been removed, prescriptions escribed to   home pharmacy

## 2021-06-01 NOTE — Interval H&P Note (Signed)
History and Physical Interval Note:  06/01/2021 8:24 AM  Julie Dyer  has presented today for surgery, with the diagnosis of hematochezia.  The various methods of treatment have been discussed with the patient and family. After consideration of risks, benefits and other options for treatment, the patient has consented to  Procedure(s): COLONOSCOPY WITH PROPOFOL (N/A) as a surgical intervention.  The patient's history has been reviewed, patient examined, no change in status, stable for surgery.  I have reviewed the patient's chart and labs.  Questions were answered to the patient's satisfaction.     Lynann Bologna

## 2021-06-01 NOTE — Anesthesia Postprocedure Evaluation (Signed)
Anesthesia Post Note  Patient: Julie Dyer  Procedure(s) Performed: COLONOSCOPY WITH PROPOFOL     Patient location during evaluation: Endoscopy Anesthesia Type: MAC Level of consciousness: awake Pain management: pain level controlled Vital Signs Assessment: post-procedure vital signs reviewed and stable Respiratory status: spontaneous breathing Cardiovascular status: stable Postop Assessment: no apparent nausea or vomiting Anesthetic complications: no   No notable events documented.  Last Vitals:  Vitals:   06/01/21 0915 06/01/21 0925  BP: 117/72 (!) 151/87  Pulse: 63 (!) 57  Resp: 15 17  Temp:    SpO2: 100% 100%    Last Pain:  Vitals:   06/01/21 0925  TempSrc:   PainSc: 0-No pain                 Kester Stimpson

## 2021-06-04 ENCOUNTER — Encounter (HOSPITAL_COMMUNITY): Payer: Self-pay | Admitting: Gastroenterology

## 2021-06-15 ENCOUNTER — Ambulatory Visit: Payer: Self-pay | Admitting: Family Medicine

## 2021-07-16 ENCOUNTER — Ambulatory Visit: Payer: Self-pay | Admitting: Family Medicine

## 2021-07-30 ENCOUNTER — Other Ambulatory Visit: Payer: Self-pay

## 2021-07-30 ENCOUNTER — Ambulatory Visit (INDEPENDENT_AMBULATORY_CARE_PROVIDER_SITE_OTHER): Payer: Self-pay | Admitting: Family Medicine

## 2021-07-30 ENCOUNTER — Encounter: Payer: Self-pay | Admitting: Family Medicine

## 2021-07-30 VITALS — BP 153/90 | HR 51 | Temp 97.5°F | Resp 16 | Ht 66.0 in | Wt 153.8 lb

## 2021-07-30 DIAGNOSIS — I10 Essential (primary) hypertension: Secondary | ICD-10-CM

## 2021-07-30 DIAGNOSIS — K649 Unspecified hemorrhoids: Secondary | ICD-10-CM

## 2021-07-30 DIAGNOSIS — K5900 Constipation, unspecified: Secondary | ICD-10-CM

## 2021-07-30 DIAGNOSIS — Z7689 Persons encountering health services in other specified circumstances: Secondary | ICD-10-CM

## 2021-07-30 MED ORDER — LOSARTAN POTASSIUM 100 MG PO TABS
100.0000 mg | ORAL_TABLET | Freq: Every day | ORAL | 0 refills | Status: DC
Start: 2021-07-30 — End: 2021-10-24

## 2021-07-30 MED ORDER — SENNOSIDES-DOCUSATE SODIUM 8.6-50 MG PO TABS: 1.0000 | ORAL_TABLET | Freq: Every day | ORAL | 5 refills | Status: DC

## 2021-07-30 MED ORDER — DOCUSATE SODIUM 100 MG PO CAPS: 100.0000 mg | ORAL_CAPSULE | Freq: Two times a day (BID) | ORAL | 5 refills | Status: DC

## 2021-07-30 MED ORDER — HYDROCORTISONE (PERIANAL) 2.5 % EX CREA: 1.0000 "application " | TOPICAL_CREAM | Freq: Two times a day (BID) | CUTANEOUS | 0 refills | Status: DC

## 2021-07-30 NOTE — Progress Notes (Signed)
Patient is new to provider. Patient request refill on  medication and Senna plus  Patient is legally blind

## 2021-07-30 NOTE — Progress Notes (Signed)
New Patient Office Visit  Subjective:  Patient ID: Julie Dyer, female    DOB: 14-Jan-1961  Age: 60 y.o. MRN: [id number]  CC:  Chief Complaint  Patient presents with   Establish Care    HPI Julie Dyer presents for to establish care. Patient reports that she has constipation and hemorrhoids. She needs refills of her meds. She reports that she drinks about 32-40 oz of water daily.   Past Medical History:  Diagnosis Date   Glaucoma     Past Surgical History:  Procedure Laterality Date   COLONOSCOPY WITH PROPOFOL N/A 06/01/2021   Procedure: COLONOSCOPY WITH PROPOFOL;  Surgeon: Lynann Bologna, MD;  Location: [facility] ENDOSCOPY;  Service: Endoscopy;  Laterality: N/A;    Family History  Problem Relation Age of Onset   Diabetes Mother    Alcohol abuse Mother    Alcohol abuse Father     Social History   Socioeconomic History   Marital status: Single    Spouse name: Not on file   Number of children: Not on file   Years of education: Not on file   Highest education level: Not on file  Occupational History   Not on file  Tobacco Use   Smoking status: Never   Smokeless tobacco: Never  Substance and Sexual Activity   Alcohol use: Not Currently   Drug use: Not Currently   Sexual activity: Not on file  Other Topics Concern   Not on file  Social History Narrative   Not on file   Social Determinants of Health   Financial Resource Strain: Not on file  Food Insecurity: Not on file  Transportation Needs: Not on file  Physical Activity: Not on file  Stress: Not on file  Social Connections: Not on file  Intimate Partner Violence: Not on file    ROS Review of Systems  Cardiovascular:  Negative for chest pain.  Gastrointestinal:  Positive for constipation.  All other systems reviewed and are negative.  Objective:   Today's Vitals: BP (!) 153/90 (BP Location: Right Arm, Patient Position: Sitting, Cuff Size: Normal)   Pulse (!) 51   Temp (!) 97.5 F (36.4 C) (Oral)    Resp 16   Ht 5\' 6"  (1.676 m)   Wt 153 lb 12.8 oz (69.8 kg)   SpO2 98%   BMI 24.82 kg/m   Physical Exam Vitals and nursing note reviewed.  Constitutional:      General: She is not in acute distress. Cardiovascular:     Rate and Rhythm: Normal rate and regular rhythm.  Pulmonary:     Effort: Pulmonary effort is normal.     Breath sounds: Normal breath sounds.  Abdominal:     Palpations: Abdomen is soft.     Tenderness: There is no abdominal tenderness.  Musculoskeletal:     Right lower leg: No edema.     Left lower leg: No edema.  Neurological:     General: No focal deficit present.     Mental Status: She is alert and oriented to person, place, and time.    Assessment & Plan:   1. Essential hypertension Elevated reading. Patient prescribed losartan 50 mg daily. Will monitor  2. Constipation, unspecified constipation type Discussed adequate fluids and fiber. Meds refilled  3. Hemorrhoids, unspecified hemorrhoid type Meds refilled. Continue prn  4. Encounter to establish care     Outpatient Encounter Medications as of 07/30/2021  Medication Sig   docusate sodium (COLACE) 100 MG capsule Take 1 capsule (100 mg  total) by mouth 2 (two) times daily.   losartan (COZAAR) 100 MG tablet Take 1 tablet (100 mg total) by mouth daily.   senna-docusate (SENNA PLUS) 8.6-50 MG tablet Take 1 tablet by mouth daily.   brimonidine (ALPHAGAN) 0.2 % ophthalmic solution Place 1 drop into the left eye 2 (two) times daily.   dibucaine (NUPERCAINAL) 1 % OINT Place 1 application rectally as needed for hemorrhoids (As needed for pain.  (no more than 30 grams of 1% ointment per day)).   dorzolamide-timolol (COSOPT) 22.3-6.8 MG/ML ophthalmic solution Place 1 drop into the left eye 2 (two) times daily.   hydrocortisone (ANUSOL-HC) 2.5 % rectal cream Place 1 application rectally 2 (two) times daily.   VYZULTA 0.024 % SOLN Place 1 drop into the left eye every evening.   [DISCONTINUED] hydrocortisone  (ANUSOL-HC) 2.5 % rectal cream Place 1 application rectally 2 (two) times daily.   [DISCONTINUED] psyllium (METAMUCIL SMOOTH TEXTURE) 28 % packet Take 1 packet by mouth daily.   No facility-administered encounter medications on file as of 07/30/2021.    Follow-up: Return in about 3 months (around 10/29/2021) for follow up, chronic med issues.   Tommie Raymond, MD

## 2021-08-03 ENCOUNTER — Ambulatory Visit: Payer: Self-pay | Admitting: Family Medicine

## 2021-10-24 ENCOUNTER — Ambulatory Visit (INDEPENDENT_AMBULATORY_CARE_PROVIDER_SITE_OTHER): Payer: Self-pay | Admitting: Family Medicine

## 2021-10-24 ENCOUNTER — Encounter: Payer: Self-pay | Admitting: Family Medicine

## 2021-10-24 ENCOUNTER — Other Ambulatory Visit: Payer: Self-pay

## 2021-10-24 VITALS — BP 115/81 | HR 69 | Temp 97.1°F | Resp 16 | Wt 162.8 lb

## 2021-10-24 DIAGNOSIS — Z136 Encounter for screening for cardiovascular disorders: Secondary | ICD-10-CM

## 2021-10-24 DIAGNOSIS — Z23 Encounter for immunization: Secondary | ICD-10-CM

## 2021-10-24 DIAGNOSIS — K649 Unspecified hemorrhoids: Secondary | ICD-10-CM

## 2021-10-24 DIAGNOSIS — Z1329 Encounter for screening for other suspected endocrine disorder: Secondary | ICD-10-CM

## 2021-10-24 DIAGNOSIS — Z1159 Encounter for screening for other viral diseases: Secondary | ICD-10-CM

## 2021-10-24 DIAGNOSIS — I10 Essential (primary) hypertension: Secondary | ICD-10-CM

## 2021-10-24 DIAGNOSIS — Z131 Encounter for screening for diabetes mellitus: Secondary | ICD-10-CM

## 2021-10-24 DIAGNOSIS — K5900 Constipation, unspecified: Secondary | ICD-10-CM

## 2021-10-24 DIAGNOSIS — Z1231 Encounter for screening mammogram for malignant neoplasm of breast: Secondary | ICD-10-CM

## 2021-10-24 DIAGNOSIS — Z13220 Encounter for screening for lipoid disorders: Secondary | ICD-10-CM

## 2021-10-24 MED ORDER — LOSARTAN POTASSIUM 100 MG PO TABS: 100.0000 mg | ORAL_TABLET | Freq: Every day | ORAL | 1 refills | Status: DC

## 2021-10-24 NOTE — Progress Notes (Signed)
Patient is here for follow-up visit.Patient is having hemorrhoids flare ups. Patient would like some cream that she is able to afford for this issues.

## 2021-10-24 NOTE — Progress Notes (Signed)
Established Patient Office Visit  Subjective:  Patient ID: Julie Dyer, female    DOB: 06-27-1961  Age: 60 y.o. MRN: [id number]  CC:  Chief Complaint  Patient presents with   Follow-up    3 month    HPI Julie Dyer presents for follow up of hemorrhoids and hypertension.  Had recent flare 2/2 constipation 2/2 diet. Patient has increased water intake.   Past Medical History:  Diagnosis Date   Glaucoma     Past Surgical History:  Procedure Laterality Date   COLONOSCOPY WITH PROPOFOL N/A 06/01/2021   Procedure: COLONOSCOPY WITH PROPOFOL;  Surgeon: Lynann Bologna, MD;  Location[facility] ENDOSCOPY;  Service: Endoscopy;  Laterality: N/A;    Family History  Problem Relation Age of Onset   Diabetes Mother    Alcohol abuse Mother    Alcohol abuse Father     Social History   Socioeconomic History   Marital status: Single    Spouse name: Not on file   Number of children: Not on file   Years of education: Not on file   Highest education level: Not on file  Occupational History   Not on file  Tobacco Use   Smoking status: Never   Smokeless tobacco: Never  Substance and Sexual Activity   Alcohol use: Not Currently   Drug use: Not Currently   Sexual activity: Not on file  Other Topics Concern   Not on file  Social History Narrative   Not on file   Social Determinants of Health   Financial Resource Strain: Not on file  Food Insecurity: Not on file  Transportation Needs: Not on file  Physical Activity: Not on file  Stress: Not on file  Social Connections: Not on file  Intimate Partner Violence: Not on file    ROS Review of Systems  Gastrointestinal:  Positive for anal bleeding and constipation.  All other systems reviewed and are negative.  Objective:   Today's Vitals: BP 115/81    Pulse 69    Temp (!) 97.1 F (36.2 C) (Oral)    Resp 16    Wt 162 lb 12.8 oz (73.8 kg)    BMI 26.28 kg/m   Physical Exam Vitals and nursing note reviewed.  Constitutional:       General: She is not in acute distress. Cardiovascular:     Rate and Rhythm: Normal rate and regular rhythm.  Pulmonary:     Effort: Pulmonary effort is normal.     Breath sounds: Normal breath sounds.  Abdominal:     Palpations: Abdomen is soft.     Tenderness: There is no abdominal tenderness.  Neurological:     General: No focal deficit present.     Mental Status: She is alert and oriented to person, place, and time.    Assessment & Plan:   .1. Hemorrhoids, unspecified hemorrhoid type Patient to utilize OTC prep prn.  Patient defers further eval/mgt at this time.   2. Constipation, unspecified constipation type Improved. Discussed adequate fluids and fiber. Patient to utilize OTC fiber prn.   3. Essential hypertension Appears stable. Monitoring labs ordered. Meds refilled.   4. Encounter for screening mammogram for malignant neoplasm of breast  - MM Digital Screening; Future  5. Need for hepatitis C screening test  - Hepatitis C antibody    Outpatient Encounter Medications as of 10/24/2021  Medication Sig   brimonidine (ALPHAGAN) 0.2 % ophthalmic solution Place 1 drop into the left eye 2 (two) times daily.   dibucaine (  NUPERCAINAL) 1 % OINT Place 1 application rectally as needed for hemorrhoids (As needed for pain.  (no more than 30 grams of 1% ointment per day)).   docusate sodium (COLACE) 100 MG capsule Take 1 capsule (100 mg total) by mouth 2 (two) times daily.   dorzolamide-timolol (COSOPT) 22.3-6.8 MG/ML ophthalmic solution Place 1 drop into the left eye 2 (two) times daily.   hydrocortisone (ANUSOL-HC) 2.5 % rectal cream Place 1 application rectally 2 (two) times daily.   losartan (COZAAR) 100 MG tablet Take 1 tablet (100 mg total) by mouth daily.   senna-docusate (SENNA PLUS) 8.6-50 MG tablet Take 1 tablet by mouth daily.   VYZULTA 0.024 % SOLN Place 1 drop into the left eye every evening.   No facility-administered encounter medications on file as of  10/24/2021.    Follow-up: No follow-ups on file.   Tommie Raymond, MD

## 2021-10-25 LAB — CMP14+EGFR
ALT: 36 IU/L — ABNORMAL HIGH (ref 0–32)
AST: 36 IU/L (ref 0–40)
Albumin/Globulin Ratio: 2 (ref 1.2–2.2)
Albumin: 4.4 g/dL (ref 3.8–4.9)
Alkaline Phosphatase: 53 IU/L (ref 44–121)
BUN/Creatinine Ratio: 19 (ref 9–23)
BUN: 18 mg/dL (ref 6–24)
Bilirubin Total: 0.8 mg/dL (ref 0.0–1.2)
CO2: 20 mmol/L (ref 20–29)
Calcium: 9.3 mg/dL (ref 8.7–10.2)
Chloride: 107 mmol/L — ABNORMAL HIGH (ref 96–106)
Creatinine, Ser: 0.93 mg/dL (ref 0.57–1.00)
Globulin, Total: 2.2 g/dL (ref 1.5–4.5)
Glucose: 95 mg/dL (ref 70–99)
Potassium: 3.8 mmol/L (ref 3.5–5.2)
Sodium: 142 mmol/L (ref 134–144)
Total Protein: 6.6 g/dL (ref 6.0–8.5)
eGFR: 71 mL/min/{1.73_m2} (ref >59)

## 2021-10-25 LAB — LIPID PANEL
Chol/HDL Ratio: 4.6 ratio — ABNORMAL HIGH (ref 0.0–4.4)
Cholesterol, Total: 228 mg/dL — ABNORMAL HIGH (ref 100–199)
HDL: 50 mg/dL (ref >39)
LDL Chol Calc (NIH): 164 mg/dL — ABNORMAL HIGH (ref 0–99)
Triglycerides: 79 mg/dL (ref 0–149)
VLDL Cholesterol Cal: 14 mg/dL (ref 5–40)

## 2021-10-25 LAB — HEMOGLOBIN A1C
Est. average glucose Bld gHb Est-mCnc: 111 mg/dL
Hgb A1c MFr Bld: 5.5 % (ref 4.8–5.6)

## 2021-10-31 ENCOUNTER — Ambulatory Visit: Payer: Self-pay | Admitting: Family Medicine

## 2021-12-21 ENCOUNTER — Telehealth: Payer: Self-pay

## 2021-12-21 NOTE — Telephone Encounter (Signed)
Called left message on voice mail kg °

## 2022-09-08 ENCOUNTER — Other Ambulatory Visit: Payer: Self-pay

## 2022-09-08 ENCOUNTER — Inpatient Hospital Stay (HOSPITAL_COMMUNITY)
Admission: EM | Admit: 2022-09-08 | Discharge: 2022-09-12 | DRG: 394 | Disposition: A | Discharge status: Home / Self Care | Payer: Self-pay | Attending: Internal Medicine | Admitting: Internal Medicine

## 2022-09-08 ENCOUNTER — Encounter (HOSPITAL_COMMUNITY): Payer: Self-pay

## 2022-09-08 DIAGNOSIS — Z833 Family history of diabetes mellitus: Secondary | ICD-10-CM

## 2022-09-08 DIAGNOSIS — K5900 Constipation, unspecified: Secondary | ICD-10-CM | POA: Diagnosis present

## 2022-09-08 DIAGNOSIS — E876 Hypokalemia: Secondary | ICD-10-CM | POA: Diagnosis present

## 2022-09-08 DIAGNOSIS — Z79899 Other long term (current) drug therapy: Secondary | ICD-10-CM

## 2022-09-08 DIAGNOSIS — Z560 Unemployment, unspecified: Secondary | ICD-10-CM

## 2022-09-08 DIAGNOSIS — Z811 Family history of alcohol abuse and dependence: Secondary | ICD-10-CM

## 2022-09-08 DIAGNOSIS — H409 Unspecified glaucoma: Secondary | ICD-10-CM | POA: Diagnosis present

## 2022-09-08 DIAGNOSIS — Z23 Encounter for immunization: Secondary | ICD-10-CM

## 2022-09-08 DIAGNOSIS — K922 Gastrointestinal hemorrhage, unspecified: Secondary | ICD-10-CM | POA: Diagnosis present

## 2022-09-08 DIAGNOSIS — K644 Residual hemorrhoidal skin tags: Secondary | ICD-10-CM | POA: Diagnosis present

## 2022-09-08 DIAGNOSIS — D509 Iron deficiency anemia, unspecified: Secondary | ICD-10-CM | POA: Diagnosis present

## 2022-09-08 DIAGNOSIS — D649 Anemia, unspecified: Secondary | ICD-10-CM

## 2022-09-08 DIAGNOSIS — K921 Melena: Secondary | ICD-10-CM | POA: Diagnosis present

## 2022-09-08 DIAGNOSIS — I10 Essential (primary) hypertension: Secondary | ICD-10-CM | POA: Diagnosis present

## 2022-09-08 DIAGNOSIS — K649 Unspecified hemorrhoids: Secondary | ICD-10-CM | POA: Diagnosis present

## 2022-09-08 DIAGNOSIS — K643 Fourth degree hemorrhoids: Principal | ICD-10-CM | POA: Diagnosis present

## 2022-09-08 DIAGNOSIS — Z597 Insufficient social insurance and welfare support: Secondary | ICD-10-CM

## 2022-09-08 DIAGNOSIS — H5789 Other specified disorders of eye and adnexa: Secondary | ICD-10-CM | POA: Diagnosis present

## 2022-09-08 DIAGNOSIS — K5790 Diverticulosis of intestine, part unspecified, without perforation or abscess without bleeding: Secondary | ICD-10-CM | POA: Diagnosis present

## 2022-09-08 DIAGNOSIS — K625 Hemorrhage of anus and rectum: Principal | ICD-10-CM

## 2022-09-08 DIAGNOSIS — K449 Diaphragmatic hernia without obstruction or gangrene: Secondary | ICD-10-CM | POA: Diagnosis present

## 2022-09-08 DIAGNOSIS — Z66 Do not resuscitate: Secondary | ICD-10-CM | POA: Diagnosis present

## 2022-09-08 DIAGNOSIS — R42 Dizziness and giddiness: Secondary | ICD-10-CM | POA: Diagnosis present

## 2022-09-08 DIAGNOSIS — D62 Acute posthemorrhagic anemia: Secondary | ICD-10-CM | POA: Diagnosis present

## 2022-09-08 DIAGNOSIS — Z1152 Encounter for screening for COVID-19: Secondary | ICD-10-CM

## 2022-09-08 HISTORY — DX: Unspecified hemorrhoids: K64.9

## 2022-09-08 LAB — BASIC METABOLIC PANEL
Anion gap: 11 (ref 5–15)
BUN: 13 mg/dL (ref 6–20)
CO2: 20 mmol/L — ABNORMAL LOW (ref 22–32)
Calcium: 9 mg/dL (ref 8.9–10.3)
Chloride: 106 mmol/L (ref 98–111)
Creatinine, Ser: 0.96 mg/dL (ref 0.44–1.00)
GFR, Estimated: >60 mL/min (ref >60)
Glucose, Bld: 110 mg/dL — ABNORMAL HIGH (ref 70–99)
Potassium: 3.9 mmol/L (ref 3.5–5.1)
Sodium: 137 mmol/L (ref 135–145)

## 2022-09-08 LAB — RETICULOCYTES
Immature Retic Fract: 30.6 % — ABNORMAL HIGH (ref 2.3–15.9)
RBC.: 2.47 MIL/uL — ABNORMAL LOW (ref 3.87–5.11)
Retic Count, Absolute: 52.4 10*3/uL (ref 19.0–186.0)
Retic Ct Pct: 2.1 % (ref 0.4–3.1)

## 2022-09-08 LAB — CBC WITH DIFFERENTIAL/PLATELET
Abs Immature Granulocytes: 0.04 10*3/uL (ref 0.00–0.07)
Basophils Absolute: 0 10*3/uL (ref 0.0–0.1)
Basophils Relative: 0 %
Eosinophils Absolute: 0 10*3/uL (ref 0.0–0.5)
Eosinophils Relative: 0 %
HCT: 15.7 % — ABNORMAL LOW (ref 36.0–46.0)
Hemoglobin: 4.1 g/dL — CL (ref 12.0–15.0)
Immature Granulocytes: 1 %
Lymphocytes Relative: 14 %
Lymphs Abs: 1.2 10*3/uL (ref 0.7–4.0)
MCH: 17.6 pg — ABNORMAL LOW (ref 26.0–34.0)
MCHC: 26.1 g/dL — ABNORMAL LOW (ref 30.0–36.0)
MCV: 67.4 fL — ABNORMAL LOW (ref 80.0–100.0)
Monocytes Absolute: 0.6 10*3/uL (ref 0.1–1.0)
Monocytes Relative: 8 %
Neutro Abs: 6.3 10*3/uL (ref 1.7–7.7)
Neutrophils Relative %: 77 %
Platelets: ADEQUATE 10*3/uL (ref 150–400)
RBC: 2.33 MIL/uL — ABNORMAL LOW (ref 3.87–5.11)
RDW: 19.3 % — ABNORMAL HIGH (ref 11.5–15.5)
WBC: 8.1 10*3/uL (ref 4.0–10.5)
nRBC: 0.2 % (ref 0.0–0.2)

## 2022-09-08 LAB — HEPATIC FUNCTION PANEL
ALT: 8 U/L (ref 0–44)
AST: 17 U/L (ref 15–41)
Albumin: 3.6 g/dL (ref 3.5–5.0)
Alkaline Phosphatase: 35 U/L — ABNORMAL LOW (ref 38–126)
Bilirubin, Direct: 0.2 mg/dL (ref 0.0–0.2)
Indirect Bilirubin: 0.9 mg/dL (ref 0.3–0.9)
Total Bilirubin: 1.1 mg/dL (ref 0.3–1.2)
Total Protein: 6.1 g/dL — ABNORMAL LOW (ref 6.5–8.1)

## 2022-09-08 LAB — RESP PANEL BY RT-PCR (FLU A&B, COVID) ARPGX2
Influenza A by PCR: NEGATIVE
Influenza B by PCR: NEGATIVE
SARS Coronavirus 2 by RT PCR: NEGATIVE

## 2022-09-08 LAB — POC OCCULT BLOOD, ED: Fecal Occult Bld: POSITIVE — AB

## 2022-09-08 LAB — PREPARE RBC (CROSSMATCH)

## 2022-09-08 LAB — FOLATE: Folate: 15.2 ng/mL (ref >5.9)

## 2022-09-08 LAB — VITAMIN B12: Vitamin B-12: 263 pg/mL (ref 180–914)

## 2022-09-08 MED ORDER — BRIMONIDINE TARTRATE 0.2 % OP SOLN
1.0000 [drp] | Freq: Two times a day (BID) | OPHTHALMIC | Status: DC
  Administered 2022-09-09 – 2022-09-12 (×6): 1 [drp] via OPHTHALMIC
  Filled 2022-09-08 (×2): qty 5

## 2022-09-08 MED ORDER — PANTOPRAZOLE INFUSION (NEW) - SIMPLE MED
8.0000 mg/h | INTRAVENOUS | Status: DC
  Filled 2022-09-08: qty 100

## 2022-09-08 MED ORDER — LACTATED RINGERS IV SOLN: INTRAVENOUS | Status: DC

## 2022-09-08 MED ORDER — ACETAMINOPHEN 650 MG RE SUPP: 650.0000 mg | Freq: Four times a day (QID) | RECTAL | Status: DC | PRN

## 2022-09-08 MED ORDER — LATANOPROSTENE BUNOD 0.024 % OP SOLN: 1.0000 [drp] | Freq: Every evening | OPHTHALMIC | Status: DC

## 2022-09-08 MED ORDER — HYDRALAZINE HCL 20 MG/ML IJ SOLN: 5.0000 mg | INTRAMUSCULAR | Status: DC | PRN

## 2022-09-08 MED ORDER — LATANOPROST 0.005 % OP SOLN
1.0000 [drp] | Freq: Every evening | OPHTHALMIC | Status: DC
  Administered 2022-09-11: 1 [drp] via OPHTHALMIC
  Filled 2022-09-08 (×2): qty 2.5

## 2022-09-08 MED ORDER — HYDROCORTISONE (PERIANAL) 2.5 % EX CREA
1.0000 | TOPICAL_CREAM | Freq: Two times a day (BID) | CUTANEOUS | Status: DC
  Administered 2022-09-09 – 2022-09-10 (×4): 1 via RECTAL
  Filled 2022-09-08 (×3): qty 28.35

## 2022-09-08 MED ORDER — HYDROCORT-PRAMOXINE (PERIANAL) 1-1 % EX FOAM
1.0000 | Freq: Two times a day (BID) | CUTANEOUS | Status: DC
  Administered 2022-09-09 – 2022-09-12 (×7): 1 via RECTAL
  Filled 2022-09-08 (×3): qty 10

## 2022-09-08 MED ORDER — LOSARTAN POTASSIUM 50 MG PO TABS
100.0000 mg | ORAL_TABLET | Freq: Every day | ORAL | Status: DC
  Administered 2022-09-11 – 2022-09-12 (×2): 100 mg via ORAL
  Filled 2022-09-08 (×3): qty 2

## 2022-09-08 MED ORDER — SODIUM CHLORIDE 0.9% IV SOLUTION: Freq: Once | INTRAVENOUS | Status: DC

## 2022-09-08 MED ORDER — ONDANSETRON HCL 4 MG/2ML IJ SOLN: 4.0000 mg | Freq: Four times a day (QID) | INTRAMUSCULAR | Status: DC | PRN

## 2022-09-08 MED ORDER — PANTOPRAZOLE 80MG IVPB - SIMPLE MED
80.0000 mg | Freq: Once | INTRAVENOUS | Status: DC
  Filled 2022-09-08: qty 100

## 2022-09-08 MED ORDER — ACETAMINOPHEN 325 MG PO TABS
650.0000 mg | ORAL_TABLET | Freq: Four times a day (QID) | ORAL | Status: DC | PRN
  Administered 2022-09-11: 650 mg via ORAL
  Filled 2022-09-08: qty 2

## 2022-09-08 MED ORDER — ONDANSETRON HCL 4 MG PO TABS: 4.0000 mg | ORAL_TABLET | Freq: Four times a day (QID) | ORAL | Status: DC | PRN

## 2022-09-08 MED ORDER — POLYETHYLENE GLYCOL 3350 17 G PO PACK
17.0000 g | PACK | Freq: Two times a day (BID) | ORAL | Status: DC
  Administered 2022-09-09 – 2022-09-12 (×6): 17 g via ORAL
  Filled 2022-09-08 (×8): qty 1

## 2022-09-08 MED ORDER — SODIUM CHLORIDE 0.9 % IV SOLN: INTRAVENOUS | Status: DC

## 2022-09-08 MED ORDER — SODIUM CHLORIDE 0.9 % IV BOLUS
1000.0000 mL | Freq: Once | INTRAVENOUS | Status: AC
  Administered 2022-09-08: 1000 mL via INTRAVENOUS

## 2022-09-08 MED ORDER — SENNOSIDES-DOCUSATE SODIUM 8.6-50 MG PO TABS
2.0000 | ORAL_TABLET | Freq: Every day | ORAL | Status: DC
  Administered 2022-09-08 – 2022-09-12 (×4): 2 via ORAL
  Filled 2022-09-08 (×5): qty 2

## 2022-09-08 MED ORDER — DORZOLAMIDE HCL-TIMOLOL MAL 2-0.5 % OP SOLN
1.0000 [drp] | Freq: Two times a day (BID) | OPHTHALMIC | Status: DC
  Administered 2022-09-09 – 2022-09-12 (×6): 1 [drp] via OPHTHALMIC
  Filled 2022-09-08 (×2): qty 10

## 2022-09-08 MED ORDER — PANTOPRAZOLE SODIUM 40 MG IV SOLR
40.0000 mg | Freq: Two times a day (BID) | INTRAVENOUS | Status: DC
  Administered 2022-09-08: 40 mg via INTRAVENOUS
  Filled 2022-09-08: qty 10

## 2022-09-08 NOTE — ED Provider Triage Note (Signed)
Emergency Medicine Provider Triage Evaluation Note  Julie Dyer , a 61 y.o. female  was evaluated in triage.  Pt complains of bleeding hemorrhoids x4 days.  She reports that she is using a pad to stop the bleeding she goes through around 3 pads a day without bleedthrough.  She reports some chills as well over the past 4 days and a feeling of shortness of breath only when she is in the shower.  Review of Systems  Positive: Bleeding hemorrhoids, chills, shortness of breath while in the shower only Negative: Chest pain, fever, fall, injury, abdominal pain, vomiting or any additional concerns  Physical Exam  BP 119/69 (BP Location: Right Arm)   Pulse 82   Temp 98.4 F (36.9 C) (Oral)   Resp 15   SpO2 100%  Gen:   Awake, no distress   Resp:  Normal effort  MSK:   Moves extremities without difficulty  Other:  Abdomen soft nontender  Medical Decision Making  Medically screening exam initiated at 2:28 PM.  Appropriate orders placed.  Cloris Flippo was informed that the remainder of the evaluation will be completed by another provider, this initial triage assessment does not replace that evaluation, and the importance of remaining in the ED until their evaluation is complete.    Note: Portions of this report may have been transcribed using voice recognition software. Every effort was made to ensure accuracy; however, inadvertent computerized transcription errors may still be present.    Deliah Boston, PA-C 09/08/22 1434

## 2022-09-08 NOTE — ED Provider Notes (Signed)
[facility] EMERGENCY DEPARTMENT Provider Note   CSN: [id number] Arrival date & time: 09/08/22  1313     History  Chief Complaint  Patient presents with   Rectal Bleeding    Julie Dyer is a 61 y.o. female with a past medical history of hypertension and external hemorrhoids presenting today due to rectal bleeding.  She states since Monday she has been noting some dark stools and occasional dark clots from her rectum.  Has a history of hemorrhoids and does report she has been more constipated recently.  Says that he has been weak, dizzy when moving, experiencing some palpitations but denies any shortness of breath.  Denies NSAIDs or chronic EtOH but does report on Friday she drank some wine.  No thinners   Rectal Bleeding Associated symptoms: light-headedness        Home Medications Prior to Admission medications   Medication Sig Start Date End Date Taking? Authorizing Provider  brimonidine (ALPHAGAN) 0.2 % ophthalmic solution Place 1 drop into the left eye 2 (two) times daily. 03/05/21   [provider]  dibucaine (NUPERCAINAL) 1 % OINT Place 1 application rectally as needed for hemorrhoids (As needed for pain.  (no more than 30 grams of 1% ointment per day)). 06/01/21   Rehman, Areeg N, DO  docusate sodium (COLACE) 100 MG capsule Take 1 capsule (100 mg total) by mouth 2 (two) times daily. 07/30/21   Georganna Skeans, MD  dorzolamide-timolol (COSOPT) 22.3-6.8 MG/ML ophthalmic solution Place 1 drop into the left eye 2 (two) times daily. 05/12/20   [provider]  hydrocortisone (ANUSOL-HC) 2.5 % rectal cream Place 1 application rectally 2 (two) times daily. 07/30/21   Georganna Skeans, MD  losartan (COZAAR) 100 MG tablet Take 1 tablet (100 mg total) by mouth daily. 10/24/21   Georganna Skeans, MD  senna-docusate (SENNA PLUS) 8.6-50 MG tablet Take 1 tablet by mouth daily. 07/30/21   Georganna Skeans, MD  VYZULTA 0.024 % SOLN Place 1 drop into the left eye every  evening. 04/27/20   [provider]      Allergies    Patient has no known allergies.    Review of Systems   Review of Systems  Respiratory:  Positive for shortness of breath.   Cardiovascular:  Positive for palpitations. Negative for chest pain.  Gastrointestinal:  Positive for blood in stool (melena) and hematochezia.  Genitourinary:  Negative for vaginal bleeding.  Neurological:  Positive for light-headedness.    Physical Exam Updated Vital Signs BP 119/69 (BP Location: Right Arm)   Pulse 82   Temp 98.4 F (36.9 C) (Oral)   Resp 15   Ht 5' 5.5" (1.664 m)   Wt 67.1 kg   SpO2 100%   BMI 24.25 kg/m  Physical Exam Vitals and nursing note reviewed.  Constitutional:      General: She is not in acute distress.    Appearance: Normal appearance. She is ill-appearing (Chronically).  HENT:     Head: Normocephalic and atraumatic.  Eyes:     General: No scleral icterus.    Conjunctiva/sclera: Conjunctivae normal.  Cardiovascular:     Rate and Rhythm: Normal rate and regular rhythm.  Pulmonary:     Effort: Pulmonary effort is normal. No respiratory distress.  Abdominal:     General: Abdomen is flat.     Palpations: Abdomen is soft.     Tenderness: There is no abdominal tenderness.  Genitourinary:    Comments: Rectal exam performed in presence of RN.  Patient with multiple external hemorrhoids, nonthrombosed and are not actively bleeding.  Some bright red blood in the stool.  No melena Skin:    Coloration: Skin is pale.     Findings: No rash.  Neurological:     Mental Status: She is alert.  Psychiatric:        Mood and Affect: Mood normal.     ED Results / Procedures / Treatments   Labs (all labs ordered are listed, but only abnormal results are displayed) Labs Reviewed  CBC WITH DIFFERENTIAL/PLATELET - Abnormal; Notable for the following components:      Result Value   RBC 2.33 (*)    Hemoglobin 4.1 (*)    HCT 15.7 (*)    MCV 67.4 (*)    MCH 17.6 (*)     MCHC 26.1 (*)    RDW 19.3 (*)    All other components within normal limits  BASIC METABOLIC PANEL - Abnormal; Notable for the following components:   CO2 20 (*)    Glucose, Bld 110 (*)    All other components within normal limits  HEPATIC FUNCTION PANEL - Abnormal; Notable for the following components:   Total Protein 6.1 (*)    Alkaline Phosphatase 35 (*)    All other components within normal limits  RESP PANEL BY RT-PCR (FLU A&B, COVID) ARPGX2  PROTIME-INR  TYPE AND SCREEN    EKG None  Radiology No results found.  Procedures .Critical Care  Performed by: Saddie Benders, PA-C Authorized by: Saddie Benders, PA-C   Critical care provider statement:    Critical care time (minutes):  30   Critical care time was exclusive of:  Separately billable procedures and treating other patients   Critical care was necessary to treat or prevent imminent or life-threatening deterioration of the following conditions:  Circulatory failure and shock   Critical care was time spent personally by me on the following activities:  Development of treatment plan with patient or surrogate, discussions with consultants, discussions with primary provider, evaluation of patient's response to treatment, obtaining history from patient or surrogate, ordering and performing treatments and interventions, ordering and review of laboratory studies, ordering and review of radiographic studies, pulse oximetry, re-evaluation of patient's condition and review of old charts    Medications Ordered in ED Medications - No data to display  ED Course/ Medical Decision Making/ A&P                           Medical Decision Making Amount and/or Complexity of Data Reviewed Labs: ordered.  Risk Prescription drug management. Decision regarding hospitalization.   61 year old female presenting with rectal bleeding.  Differential includes but is not limited to PUD, esophagitis, EtOH, NSAID use, varices, IBD,  colorectal cancer.    This is not an exhaustive differential.    Past Medical History / Co-morbidities / Social History: Hemorrhoids, constipation, hypertension   Additional history: Per chart review patient presented similarly in July 2022.  Her hemoglobin was also 4.1.  She required admission and transfusion.  Colonoscopy in July 2022 with Dr. Chales Abrahams revealed diverticulosis and grade 4 hemorrhoids.  Hemorrhoids are still present today.  Do not see any history of endoscopy   Physical Exam: Pertinent physical exam findings include Uncomplicated external hemorrhoids  Lab Tests: I ordered, and personally interpreted labs.  The pertinent results include: Positive fecal occult Hemoglobin 4.1 Normal white and likely normal platelets however there are some cramping of the  platelet   Imaging Studies: No tenderness on abdominal exam, no imaging indicated   Cardiac Monitoring:  The patient was maintained on a cardiac monitor.  I viewed and interpreted the cardiac monitored which showed an underlying rhythm of: NSR   Medications: I ordered medication including pantoprazole and fluids.    Consultations Obtained: I spoke with Dr. Silverio Decamp with [facility] who will put the patient on their list.  They suggest twice daily PPI however does not need drip.  MDM/Disposition: This is a 61 year old female who presented today due to rectal bleeding for nearly a week.  Describes it as dark.  Hemoglobin today 4.1.  Stable blood pressures and not tachycardic.  Does not feel pale and weak.  Spoke with GI and patient started on twice daily PPI.  Will require admission and GI will see her and decide if any procedures are necessary.  Patient and her husband are agreeable    I discussed this case with my attending physician Dr. Regenia Skeeter who cosigned this note including patient's presenting symptoms, physical exam, and planned diagnostics and interventions. Attending physician stated agreement with plan or  made changes to plan which were implemented.     Final Clinical Impression(s) / ED Diagnoses Final diagnoses:  Rectal bleeding  Anemia, unspecified type    Rx / DC Orders ED Discharge Orders     None      Admit to Dr. Stanford Scotland, Lakindra Wible A, PA-C 09/08/22 1655    Sherwood Gambler, MD 09/12/22 1948

## 2022-09-08 NOTE — ED Notes (Signed)
IV at bedside and reports will not help and obtain second PIV access.

## 2022-09-08 NOTE — ED Triage Notes (Addendum)
Pt arrived POV from home stating she has been bleeding from her hemorrhoids x4 days. Has a hx of the same. Pt endorses some dizziness and fatigue.

## 2022-09-08 NOTE — ED Notes (Signed)
Admitting at bedside 

## 2022-09-08 NOTE — Progress Notes (Signed)
A consult was placed to IV Therapy for lab draws and IV access for the pt;  upon arrival, RN reported that the pt has a 22ga iv in her hand, but still needs labs drawn;  explained that pt has adequate access and that blood can be transfused through a 22ga;  asked that phlebotomy draw her labs.

## 2022-09-08 NOTE — H&P (Signed)
History and Physical    Patient: Julie Dyer PIR:[id number] DOB: 1961-09-20 DOA: 09/08/2022 DOS: the patient was seen and examined on 09/08/2022 PCP: Dorna Mai, MD  Patient coming from: Home - lives with husband and daughter; NOK: Daughter, Gloris Ham, [phone]   Chief Complaint: rectal bleeding  HPI: Julie Dyer is a 61 y.o. female with medical history significant of glaucoma presenting with rectal bleeding. She was previously hospitalized in 7/22 with bleeding hemorrhoids; colonoscopy showed large prolapsed hemorrhoids and she was transfused and discharged.  She has severe glaucoma and cannot work so she has no insurance.  As a result, she never followed up with surgery as recommended.  She started with severe bleeding again last Wednesday and became SOB and fatigued with exertion Friday (2 days PTA).  Bleeding is ongoing.    ER Course:  Anemia, Hgb 4.  Rectal bleeding x 1 week, dark with clots.  BRB on rectal, numerous hemorrhoids on exam.  GI will consult.  Protonix BID.     Review of Systems: As mentioned in the history of present illness. All other systems reviewed and are negative. Past Medical History:  Diagnosis Date   Glaucoma    Past Surgical History:  Procedure Laterality Date   COLONOSCOPY WITH PROPOFOL N/A 06/01/2021   Procedure: COLONOSCOPY WITH PROPOFOL;  Surgeon: Jackquline Denmark, MD;  Location: [facility] ENDOSCOPY;  Service: Endoscopy;  Laterality: N/A;   Social History:  reports that she has never smoked. She has never used smokeless tobacco. She reports that she does not currently use alcohol. She reports that she does not currently use drugs.  No Known Allergies  Family History  Problem Relation Age of Onset   Diabetes Mother    Alcohol abuse Mother    Alcohol abuse Father     Prior to Admission medications   Medication Sig Start Date End Date Taking? Authorizing Provider  brimonidine (ALPHAGAN) 0.2 % ophthalmic solution Place 1 drop into the left eye  2 (two) times daily. 03/05/21   [provider]  dibucaine (NUPERCAINAL) 1 % OINT Place 1 application rectally as needed for hemorrhoids (As needed for pain.  (no more than 30 grams of 1% ointment per day)). 06/01/21   Rehman, Areeg N, DO  docusate sodium (COLACE) 100 MG capsule Take 1 capsule (100 mg total) by mouth 2 (two) times daily. 07/30/21   Dorna Mai, MD  dorzolamide-timolol (COSOPT) 22.3-6.8 MG/ML ophthalmic solution Place 1 drop into the left eye 2 (two) times daily. 05/12/20   [provider]  hydrocortisone (ANUSOL-HC) 2.5 % rectal cream Place 1 application rectally 2 (two) times daily. 07/30/21   Dorna Mai, MD  losartan (COZAAR) 100 MG tablet Take 1 tablet (100 mg total) by mouth daily. 10/24/21   Dorna Mai, MD  senna-docusate (SENNA PLUS) 8.6-50 MG tablet Take 1 tablet by mouth daily. 07/30/21   Dorna Mai, MD  VYZULTA 0.024 % SOLN Place 1 drop into the left eye every evening. 04/27/20   [provider]    Physical Exam: Vitals:   09/08/22 1418 09/08/22 1434 09/08/22 1636 09/08/22 1700  BP: 119/69  139/71 (!) 141/72  Pulse: 82  74   Resp: 15  15 (!) 22  Temp: 98.4 F (36.9 C)     TempSrc: Oral     SpO2: 100%  99%   Weight:  67.1 kg    Height:  5' 5.5" (1.664 m)     General:  Appears calm and comfortable and is in NAD, pale Eyes:  EOMI, normal lids, iris ENT:  grossly normal hearing, lips & tongue, mmm Neck:  no LAD, masses or thyromegaly Cardiovascular:  RRR, no m/r/g. No LE edema.  Respiratory:   CTA bilaterally with no wheezes/rales/rhonchi.  Normal respiratory effort. Abdomen:  soft, NT, ND Skin:  no rash or induration seen on limited exam Musculoskeletal:  grossly normal tone BUE/BLE, good ROM, no bony abnormality Psychiatric: blunted mood and affect, speech fluent and appropriate, AOx3 Neurologic:  CN 2-12 grossly intact, moves all extremities in coordinated fashion   Radiological Exams on Admission: Independently reviewed  - see discussion in A/P where applicable  No results found.  EKG: Independently reviewed.  NSR with rate 76; no evidence of acute ischemia   Labs on Admission: I have personally reviewed the available labs and imaging studies at the time of the admission.  Pertinent labs:    Unremarkable CMP WBC 8.1 Hgb 4.1 Heme positive COVID/flu negative   Assessment and Plan: Principal Problem:   Acute lower GI bleeding Active Problems:   Hemorrhoids   ABLA (acute blood loss anemia)   Glaucoma (increased eye pressure)   Essential hypertension   DNR (do not resuscitate)    Lower Gi Bleeding -History of large prolapsed hemorrhoids requiring transfusion, very likely having the same issue now -Will admit to med surg -GI consult has been requested -Will continue Protonix IV BID, but low suspicion for UGI bleeding at this time -She needs a good bowel regimen to avoid constipation; will start Miralax BID and Senna S 2 tabs daily -Will add Proctofoam TID, Sitz baths q4h, and Anusol  ABLA -Patient's lightheadedness, SOB, and fatigue are most likely caused by anemia secondary to lower GI bleeding.  -Her Hgb is 4.1.  -Type and screen were done in ED.  -Three units of blood were ordered by ED.  -Recheck Hgb in the AM, as more blood may be needed at that time  Glaucoma -Continue brimonidine, Cosopt, and Vyzulta  HTN -Continue Cozaar   No insurance -Will request TOC assistance, as she would likely benefit from [facility] and/or Medicare  DNR -I have discussed code status with the patient and she would prefer to die a natural death should that situation arise. -She will need a gold out of facility DNR form at the time of discharge     Advance Care Planning:   Code Status: DNR   Consults: GI; TOC team  DVT Prophylaxis: SCDs  Family Communication: None present; she is capable of communicating with her family at this time  Severity of Illness: The appropriate patient status for this  patient is INPATIENT. Inpatient status is judged to be reasonable and necessary in order to provide the required intensity of service to ensure the patient's safety. The patient's presenting symptoms, physical exam findings, and initial radiographic and laboratory data in the context of their chronic comorbidities is felt to place them at high risk for further clinical deterioration. Furthermore, it is not anticipated that the patient will be medically stable for discharge from the hospital within 2 midnights of admission.   * I certify that at the point of admission it is my clinical judgment that the patient will require inpatient hospital care spanning beyond 2 midnights from the point of admission due to high intensity of service, high risk for further deterioration and high frequency of surveillance required.*  Author: Jonah Blue, MD 09/08/2022 5:30 PM  For on call review [email or web address].

## 2022-09-09 ENCOUNTER — Encounter (HOSPITAL_COMMUNITY): Payer: Self-pay | Admitting: Internal Medicine

## 2022-09-09 DIAGNOSIS — D500 Iron deficiency anemia secondary to blood loss (chronic): Secondary | ICD-10-CM

## 2022-09-09 DIAGNOSIS — K649 Unspecified hemorrhoids: Secondary | ICD-10-CM

## 2022-09-09 LAB — BPAM RBC
Blood Product Expiration Date: 202311292359
Blood Product Expiration Date: 202311302359
Blood Product Expiration Date: 202312012359
ISSUE DATE / TIME: 202311051829
ISSUE DATE / TIME: 202311052053
ISSUE DATE / TIME: 202311052348
Unit Type and Rh: 5100
Unit Type and Rh: 5100
Unit Type and Rh: 5100

## 2022-09-09 LAB — TYPE AND SCREEN
ABO/RH(D): O POS
Antibody Screen: NEGATIVE
Unit division: 0
Unit division: 0
Unit division: 0

## 2022-09-09 LAB — MAGNESIUM: Magnesium: 2.1 mg/dL (ref 1.7–2.4)

## 2022-09-09 LAB — BASIC METABOLIC PANEL
Anion gap: 9 (ref 5–15)
BUN: 6 mg/dL (ref 6–20)
CO2: 20 mmol/L — ABNORMAL LOW (ref 22–32)
Calcium: 8.6 mg/dL — ABNORMAL LOW (ref 8.9–10.3)
Chloride: 112 mmol/L — ABNORMAL HIGH (ref 98–111)
Creatinine, Ser: 0.75 mg/dL (ref 0.44–1.00)
GFR, Estimated: >60 mL/min (ref >60)
Glucose, Bld: 93 mg/dL (ref 70–99)
Potassium: 3.4 mmol/L — ABNORMAL LOW (ref 3.5–5.1)
Sodium: 141 mmol/L (ref 135–145)

## 2022-09-09 LAB — CBC
HCT: 25.2 % — ABNORMAL LOW (ref 36.0–46.0)
Hemoglobin: 7.8 g/dL — ABNORMAL LOW (ref 12.0–15.0)
MCH: 22.6 pg — ABNORMAL LOW (ref 26.0–34.0)
MCHC: 31 g/dL (ref 30.0–36.0)
MCV: 73 fL — ABNORMAL LOW (ref 80.0–100.0)
Platelets: 185 10*3/uL (ref 150–400)
RBC: 3.45 MIL/uL — ABNORMAL LOW (ref 3.87–5.11)
RDW: 21.7 % — ABNORMAL HIGH (ref 11.5–15.5)
WBC: 8 10*3/uL (ref 4.0–10.5)
nRBC: 0 % (ref 0.0–0.2)

## 2022-09-09 LAB — PROTIME-INR
INR: 1.1 (ref 0.8–1.2)
Prothrombin Time: 14.4 seconds (ref 11.4–15.2)

## 2022-09-09 MED ORDER — POTASSIUM CHLORIDE CRYS ER 20 MEQ PO TBCR
40.0000 meq | EXTENDED_RELEASE_TABLET | Freq: Once | ORAL | Status: AC
  Administered 2022-09-09: 40 meq via ORAL
  Filled 2022-09-09: qty 2

## 2022-09-09 MED ORDER — DOCUSATE SODIUM 100 MG PO CAPS
200.0000 mg | ORAL_CAPSULE | Freq: Two times a day (BID) | ORAL | Status: DC
  Administered 2022-09-09 – 2022-09-12 (×7): 200 mg via ORAL
  Filled 2022-09-09 (×7): qty 2

## 2022-09-09 MED ORDER — FOLIC ACID 1 MG PO TABS
1.0000 mg | ORAL_TABLET | Freq: Every day | ORAL | Status: DC
  Administered 2022-09-09 – 2022-09-12 (×4): 1 mg via ORAL
  Filled 2022-09-09 (×4): qty 1

## 2022-09-09 MED ORDER — PANTOPRAZOLE SODIUM 40 MG PO TBEC
40.0000 mg | DELAYED_RELEASE_TABLET | Freq: Every day | ORAL | Status: DC
  Administered 2022-09-09: 40 mg via ORAL
  Filled 2022-09-09: qty 1

## 2022-09-09 MED ORDER — INFLUENZA VAC SPLIT QUAD 0.5 ML IM SUSY
0.5000 mL | PREFILLED_SYRINGE | INTRAMUSCULAR | Status: AC
  Administered 2022-09-10: 0.5 mL via INTRAMUSCULAR
  Filled 2022-09-09: qty 0.5

## 2022-09-09 MED ORDER — ORAL CARE MOUTH RINSE: 15.0000 mL | OROMUCOSAL | Status: DC | PRN

## 2022-09-09 MED ORDER — FERROUS SULFATE 325 (65 FE) MG PO TABS
325.0000 mg | ORAL_TABLET | Freq: Every day | ORAL | Status: DC
  Administered 2022-09-09 – 2022-09-12 (×4): 325 mg via ORAL
  Filled 2022-09-09 (×4): qty 1

## 2022-09-09 NOTE — Consult Note (Addendum)
Consultation  Referring Provider:   [facility] Primary Care Physician:  Dorna Mai, MD Primary Gastroenterologist:  Althia Forts       Reason for Consultation:     Symptomatic anemia in setting of hematochezia and melena   Impression    Symptomatic microcytic anemia Without hemodynamic instability, no blood thinners Similar presentation 2022 with colonoscopy showing large internal hemorrhoids, had 3 PRBCs at that time. Admitting hemoglobin 4.1 status post 3 units PRBC with appropriate increase to 7.8. Baseline hemoglobin 9, not on iron outpatient.  Pending iron studies Elevation of BUN likely not acute bleeding. Not on iron outpatient.  Hematochezia/Melena Right red blood here in the ER, patient complaining of some melena at home. Elevation of BUN, occasional red wine, no NSAIDs.  Prolapsed hemorrhoids Intermittent bright red blood, with pain.  Constipation Bowel movement every 2 weeks  Glaucoma  DNR  Financial hardships    LOS: 1 day     Plan   -Patient with recent colonoscopy for similar presentation a year ago, likely this represents hemorrhoidal bleeding without BUN elevation however patient has had some dark black stools, will evaluate with endoscopy to assure the primary sources lower GI. -Continue Protonix 40 mg IV BID. -Clear liquid diet, NPO at midnight. --Continue to monitor H&H with transfusion as needed to maintain hemoglobin greater than 7. -Plan for EGD tomoorrow due to lack of availability and anesthesia today. If EGD unremarkable, plan for general surgery consult.   I thoroughly discussed the procedure to include nature, alternatives, benefits, and risks including but not limited to bleeding, perforation, infection, anesthesia/cardiac and pulmonary complications. Patient provides understanding and gave verbal consent to proceed. -Continue symptomatic treatment of large hemorrhoids with Anusol and Proctofoam -Keep stools loose with MiraLAX twice  daily, Senokot -Pending iron and ferritin, will likely benefit from IV iron this hospitalization  Thank you for your kind consultation, we will continue to follow.         HPI:   Julie Dyer is a 61 y.o. female with past medical history significant for hypertension, glaucoma, chronic microcytic anemia with admission 2022 status post 3 PRBCs, symptomatic hemorrhoids, presented to ER with small vol hemorrhoidal bleeding X 1 week .   During admission 2022 for IDA had colonoscopy with Dr. Lyndel Safe with large internal /external hemorrhoids with ulceration.  Patient received 3 PRBCs during that admission.  She was referred to general surgery for prolapsed hemorrhoids however due to lack of insurance patient never went.  Presents yesterday to the ER with rectal bleeding for a week with presenting shortness of breath and fatigue, admitting hemoglobin 4.1, has received 3 units PRBC this admission, hemoglobin this morning 7.8.  Pressure baseline around 9 microcytosis 73 Iron and ferritin.  Patient BUN is 18, 13 and 6, no elevation of BUN.  Potassium 3.4.  Patient states that she was admitted last year she has continued to see intermittent bright red blood in her stool.  Has constipation with 2 bowel movements a week.  Was on MiraLAX previously but due to financial hardship has been off for at least 1 to 2 months, takes senna plus once every other week.  And Colace. Patient states the last 2 months she has had worsening bleeding with some dark black stool and black blood per rectum, after using MiraLAX and having bowel movement here just saw bright red blood. She states she continues to have painful hemorrhoids, can feel them prolapse will have to push them back in. Patient denies reflux,  dysphagia.  Does have clearing of her throat and some upper abdominal discomfort with specific foods.  Has nausea occasionally with movement otherwise denies nausea and vomiting. Patient denies NSAIDs, rare red wine had 3  glasses Saturday, she is not on outpatient iron. Patient did have breakfast tray in front of her this morning last drink at 8 AM. Patient lives with her daughter and fianc, due to her glaucoma she does not work or drive.  Has no insurance.  Is DNR.   Abnormal ED labs: Abnormal Labs Reviewed  CBC WITH DIFFERENTIAL/PLATELET - Abnormal; Notable for the following components:      Result Value   RBC 2.33 (*)    Hemoglobin 4.1 (*)    HCT 15.7 (*)    MCV 67.4 (*)    MCH 17.6 (*)    MCHC 26.1 (*)    RDW 19.3 (*)    All other components within normal limits  BASIC METABOLIC PANEL - Abnormal; Notable for the following components:   CO2 20 (*)    Glucose, Bld 110 (*)    All other components within normal limits  HEPATIC FUNCTION PANEL - Abnormal; Notable for the following components:   Total Protein 6.1 (*)    Alkaline Phosphatase 35 (*)    All other components within normal limits  RETICULOCYTES - Abnormal; Notable for the following components:   RBC. 2.47 (*)    Immature Retic Fract 30.6 (*)    All other components within normal limits  CBC - Abnormal; Notable for the following components:   RBC 3.45 (*)    Hemoglobin 7.8 (*)    HCT 25.2 (*)    MCV 73.0 (*)    MCH 22.6 (*)    RDW 21.7 (*)    All other components within normal limits  BASIC METABOLIC PANEL - Abnormal; Notable for the following components:   Potassium 3.4 (*)    Chloride 112 (*)    CO2 20 (*)    Calcium 8.6 (*)    All other components within normal limits  POC OCCULT BLOOD, ED - Abnormal; Notable for the following components:   Fecal Occult Bld POSITIVE (*)    All other components within normal limits     Past Medical History:  Diagnosis Date   Glaucoma     Surgical History:  She  has a past surgical history that includes Colonoscopy with propofol (N/A, 06/01/2021). Family History:  Her family history includes Alcohol abuse in her father and mother; Diabetes in her mother. Social History:   reports  that she has never smoked. She has never used smokeless tobacco. She reports that she does not currently use alcohol. She reports that she does not currently use drugs.  Prior to Admission medications   Medication Sig Start Date End Date Taking? Authorizing Provider  brimonidine (ALPHAGAN) 0.2 % ophthalmic solution Place 1 drop into the left eye 2 (two) times daily. 03/05/21  Yes [provider]  dibucaine (NUPERCAINAL) 1 % OINT Place 1 application rectally as needed for hemorrhoids (As needed for pain.  (no more than 30 grams of 1% ointment per day)). 06/01/21  Yes Rehman, Areeg N, DO  dorzolamide-timolol (COSOPT) 22.3-6.8 MG/ML ophthalmic solution Place 1 drop into the left eye 2 (two) times daily. 05/12/20  Yes [provider]  hydrocortisone (ANUSOL-HC) 2.5 % rectal cream Place 1 application rectally 2 (two) times daily. 07/30/21  Yes Georganna Skeans, MD  losartan (COZAAR) 100 MG tablet Take 1 tablet (100 mg total)  by mouth daily. 10/24/21  Yes Georganna Skeans, MD  VYZULTA 0.024 % SOLN Place 1 drop into the left eye every evening. 04/27/20  Yes [provider]  senna-docusate (SENNA PLUS) 8.6-50 MG tablet Take 1 tablet by mouth daily. Patient not taking: Reported on 09/08/2022 07/30/21   Georganna Skeans, MD    Current Facility-Administered Medications  Medication Dose Route Frequency Provider Last Rate Last Admin   0.9 %  sodium chloride infusion (Manually program via Guardrails IV Fluids)   Intravenous Once Jonah Blue, MD   Held at 09/08/22 1840   acetaminophen (TYLENOL) tablet 650 mg  650 mg Oral Q6H PRN Jonah Blue, MD       Or   acetaminophen (TYLENOL) suppository 650 mg  650 mg Rectal Q6H PRN Jonah Blue, MD       brimonidine (ALPHAGAN) 0.2 % ophthalmic solution 1 drop  1 drop Left Eye BID Jonah Blue, MD       dorzolamide-timolol (COSOPT) 2-0.5 % ophthalmic solution 1 drop  1 drop Left Eye BID Jonah Blue, MD       hydrALAZINE (APRESOLINE) injection  5 mg  5 mg Intravenous Q4H PRN Jonah Blue, MD       hydrocortisone (ANUSOL-HC) 2.5 % rectal cream 1 Application  1 Application Rectal BID Jonah Blue, MD       hydrocortisone-pramoxine [facility]) rectal foam 1 applicator  1 applicator Rectal BID Jonah Blue, MD       lactated ringers infusion   Intravenous Continuous Jonah Blue, MD   Held at 09/08/22 1839   latanoprost (XALATAN) 0.005 % ophthalmic solution 1 drop  1 drop Left Eye QPM Jonah Blue, MD       losartan (COZAAR) tablet 100 mg  100 mg Oral Daily Jonah Blue, MD       ondansetron [facility]) tablet 4 mg  4 mg Oral Q6H PRN Jonah Blue, MD       Or   ondansetron [facility]) injection 4 mg  4 mg Intravenous Q6H PRN Jonah Blue, MD       pantoprazole (PROTONIX) injection 40 mg  40 mg Intravenous Steva Colder, MD   40 mg at 09/08/22 2307   polyethylene glycol (MIRALAX / GLYCOLAX) packet 17 g  17 g Oral BID Jonah Blue, MD       senna-docusate (Senokot-S) tablet 2 tablet  2 tablet Oral Daily Jonah Blue, MD   2 tablet at 09/08/22 1844   Current Outpatient Medications  Medication Sig Dispense Refill   brimonidine (ALPHAGAN) 0.2 % ophthalmic solution Place 1 drop into the left eye 2 (two) times daily.     dibucaine (NUPERCAINAL) 1 % OINT Place 1 application rectally as needed for hemorrhoids (As needed for pain.  (no more than 30 grams of 1% ointment per day)). 28 g 0   dorzolamide-timolol (COSOPT) 22.3-6.8 MG/ML ophthalmic solution Place 1 drop into the left eye 2 (two) times daily.     hydrocortisone (ANUSOL-HC) 2.5 % rectal cream Place 1 application rectally 2 (two) times daily. 30 g 0   losartan (COZAAR) 100 MG tablet Take 1 tablet (100 mg total) by mouth daily. 90 tablet 1   VYZULTA 0.024 % SOLN Place 1 drop into the left eye every evening.     senna-docusate (SENNA PLUS) 8.6-50 MG tablet Take 1 tablet by mouth daily. (Patient not taking: Reported on 09/08/2022) 60 tablet 5    Allergies as  of 09/08/2022   (No Known Allergies)    Review of Systems:  Constitutional: No weight loss, fever, chills, weakness or fatigue HEENT: Eyes: No change in vision               Ears, Nose, Throat:  No change in hearing or congestion Skin: No rash or itching Cardiovascular: No chest pain, chest pressure or palpitations   Respiratory: No SOB or cough Gastrointestinal: See HPI and otherwise negative Genitourinary: No dysuria or change in urinary frequency Neurological: No headache, dizziness or syncope Musculoskeletal: No new muscle or joint pain Hematologic: No bleeding or bruising Psychiatric: No history of depression or anxiety     Physical Exam:  Vital signs in last 24 hours: Temp:  [97.6 F (36.4 C)-98.6 F (37 C)] 97.6 F (36.4 C) (11/06 0722) Pulse Rate:  [72-119] 85 (11/06 0715) Resp:  [14-28] 18 (11/06 0715) BP: (119-184)/(69-96) 169/84 (11/06 0715) SpO2:  [90 %-100 %] 100 % (11/06 0715) Weight:  [67.1 kg] 67.1 kg (11/05 1434) Last BM Date : 09/08/22 Last BM recorded by nurses in past 5 days No data recorded  General:   Pleasant, well developed female in no acute distress Head:  Normocephalic and atraumatic. Eyes: sclerae anicteric,conjunctive pink  Heart:  regular rate and rhythm Pulm: Clear anteriorly; no wheezing Abdomen:  Soft, Non-distended AB, Active bowel sounds. No tenderness . Without guarding and Without rebound, No organomegaly appreciated. Extremities:  Without edema. Rectal: declines had in ER Msk:  Symmetrical without gross deformities. Peripheral pulses intact.  Neurologic:  Alert and  oriented x4;  No focal deficits.  Skin:   Dry and intact without significant lesions or rashes. Psychiatric:  Cooperative. Normal mood and affect.  LAB RESULTS: Recent Labs    09/08/22 1433 09/09/22 0618  WBC 8.1 8.0  HGB 4.1* 7.8*  HCT 15.7* 25.2*  PLT PLATELET CLUMPS NOTED ON SMEAR, COUNT APPEARS ADEQUATE 185   BMET Recent Labs    09/08/22 1433  09/09/22 0618  NA 137 141  K 3.9 3.4*  CL 106 112*  CO2 20* 20*  GLUCOSE 110* 93  BUN 13 6  CREATININE 0.96 0.75  CALCIUM 9.0 8.6*   LFT Recent Labs    09/08/22 1433  PROT 6.1*  ALBUMIN 3.6  AST 17  ALT 8  ALKPHOS 35*  BILITOT 1.1  BILIDIR 0.2  IBILI 0.9   PT/INR Recent Labs    09/09/22 0618  LABPROT 14.4  INR 1.1    STUDIES: No results found.   Doree Albee  09/09/2022, 8:06 AM  GI ATTENDING  History, laboratories, x-rays, prior endoscopy report reviewed.  Patient seen and examined.  Agree with comprehensive consultation note as outlined above.  We are asked to see the patient for recurrent significant anemia (likely iron deficiency with microcytic indices) and rectal bleeding.  Colonoscopy 1 year ago with large ulcerated prolapsing hemorrhoids not amenable to medical therapy or an office banding.  Suspect problems with bleeding and recurrent anemia due to the same.  We will perform upper endoscopy to exclude other concurrent pathology.  If negative, recommend surgical consultation with colorectal surgeon for definitive treatment of hemorrhoids.  Endoscopy scheduled for tomorrow morning.  Wilhemina Bonito. Eda Keys., M.D. [facility] Division of Gastroenterology

## 2022-09-09 NOTE — ED Notes (Addendum)
Pt assisted to use bed pan.

## 2022-09-09 NOTE — Progress Notes (Signed)
PROGRESS NOTE                                                                                                                                                                                                             Patient Demographics:    Julie Dyer, is a 61 y.o. female, DOB - August 18, 1961, EXH:[id number]  Outpatient Primary MD for the patient is Dorna Mai, MD    LOS - 1  Admit date - 09/08/2022    Chief Complaint  Patient presents with   Rectal Bleeding       Brief Narrative (HPI from H&P)   61 y.o. female with medical history significant of glaucoma presenting with rectal bleeding. She was previously hospitalized in 7/22 with bleeding hemorrhoids; colonoscopy showed large prolapsed hemorrhoids and she was transfused and discharged.  She has severe glaucoma and cannot work so she has no insurance.  As a result, she never followed up with surgery as recommended.  She started with severe bleeding again last Wednesday and became SOB and fatigued with exertion Friday, in the ER she was found to have profound anemia due to lower GI blood loss and was admitted to the hospital.   Subjective:    [name] today has, No headache, No chest pain, No abdominal pain - No Nausea, No new weakness tingling or numbness, shortness of breath, no further bleeding.   Assessment  & Plan :    Symptomatic acute lower GI bleed related anemia in a patient with known history of bleeding hemorrhoids. She has known history of bleeding hemorrhoids however could not follow-up outpatient due to lack of insurance, currently she is s/p 3 units of packed RBC transfusion, posttransfusion H&H stable, placed on stool softeners, oral iron and folic acid, GI to follow.  Continue to monitor CBC.  Bleeding seems to have improved.  Also on PPI currently.   Glaucoma.  Continue home regimen of brimonidine, Cosopt, and Vyzulta .  Hypertension.  On  Cozaar.  Hypokalemia.  Replaced.       Condition - Fair  Family Communication  :  None  Code Status :  Full  Consults  :  GI  PUD Prophylaxis : PPI   Procedures  :           Disposition Plan  :    Status is: Inpatient  DVT Prophylaxis  :    SCDs Start: 09/08/22 1727   Lab Results  Component Value Date   PLT 185 09/09/2022    Diet :  Diet Order             Diet clear liquid Room service appropriate? Yes; Fluid consistency: Thin  Diet effective now                    Inpatient Medications  Scheduled Meds:  sodium chloride   Intravenous Once   brimonidine  1 drop Left Eye BID   dorzolamide-timolol  1 drop Left Eye BID   ferrous sulfate  325 mg Oral Q breakfast   folic acid  1 mg Oral Daily   hydrocortisone  1 Application Rectal BID   hydrocortisone-pramoxine  1 applicator Rectal BID   latanoprost  1 drop Left Eye QPM   losartan  100 mg Oral Daily   pantoprazole (PROTONIX) IV  40 mg Intravenous Q12H   polyethylene glycol  17 g Oral BID   potassium chloride  40 mEq Oral Once   senna-docusate  2 tablet Oral Daily   Continuous Infusions:  lactated ringers Stopped (09/08/22 1839)   PRN Meds:.acetaminophen **OR** acetaminophen, hydrALAZINE, ondansetron **OR** ondansetron (ZOFRAN) IV  Antibiotics  :    Anti-infectives (From admission, onward)    None         Objective:   Vitals:   09/09/22 0715 09/09/22 0722 09/09/22 0800 09/09/22 0845  BP: (!) 169/84  (!) 151/78 (!) 153/77  Pulse: 85  79 94  Resp: 18  17 19   Temp:  97.6 F (36.4 C)    TempSrc:  Oral    SpO2: 100%  100% 100%  Weight:      Height:        Wt Readings from Last 3 Encounters:  09/08/22 67.1 kg  10/24/21 73.8 kg  07/30/21 69.8 kg     Intake/Output Summary (Last 24 hours) at 09/09/2022 0902 Last data filed at 09/09/2022 0258 Gross per 24 hour  Intake 1639.95 ml  Output --  Net 1639.95 ml     Physical Exam  Awake Alert, No new F.N deficits, Normal  affect [place].AT,PERRAL Supple Neck, No JVD,   Symmetrical Chest wall movement, Good air movement bilaterally, CTAB RRR,No Gallops,Rubs or new Murmurs,  +ve B.Sounds, Abd Soft, No tenderness,   No Cyanosis, Clubbing or edema     Data Review:    CBC Recent Labs  Lab 09/08/22 1433 09/09/22 0618  WBC 8.1 8.0  HGB 4.1* 7.8*  HCT 15.7* 25.2*  PLT PLATELET CLUMPS NOTED ON SMEAR, COUNT APPEARS ADEQUATE 185  MCV 67.4* 73.0*  MCH 17.6* 22.6*  MCHC 26.1* 31.0  RDW 19.3* 21.7*  LYMPHSABS 1.2  --   MONOABS 0.6  --   EOSABS 0.0  --   BASOSABS 0.0  --     Electrolytes Recent Labs  Lab 09/08/22 1433 09/09/22 0618  NA 137 141  K 3.9 3.4*  CL 106 112*  CO2 20* 20*  GLUCOSE 110* 93  BUN 13 6  CREATININE 0.96 0.75  CALCIUM 9.0 8.6*  AST 17  --   ALT 8  --   ALKPHOS 35*  --   BILITOT 1.1  --   ALBUMIN 3.6  --   MG  --  2.1  INR  --  1.1     Radiology Reports No results found.    Signature  13/06/23 M.D on 09/09/2022 at  9:02 AM   -  To page go to [email or web address]

## 2022-09-09 NOTE — H&P (View-Only) (Signed)
Consultation  Referring Provider:   [facility] Primary Care Physician:  Dorna Mai, MD Primary Gastroenterologist:  Althia Forts       Reason for Consultation:     Symptomatic anemia in setting of hematochezia and melena   Impression    Symptomatic microcytic anemia Without hemodynamic instability, no blood thinners Similar presentation 2022 with colonoscopy showing large internal hemorrhoids, had 3 PRBCs at that time. Admitting hemoglobin 4.1 status post 3 units PRBC with appropriate increase to 7.8. Baseline hemoglobin 9, not on iron outpatient.  Pending iron studies Elevation of BUN likely not acute bleeding. Not on iron outpatient.  Hematochezia/Melena Right red blood here in the ER, patient complaining of some melena at home. Elevation of BUN, occasional red wine, no NSAIDs.  Prolapsed hemorrhoids Intermittent bright red blood, with pain.  Constipation Bowel movement every 2 weeks  Glaucoma  DNR  Financial hardships    LOS: 1 day     Plan   -Patient with recent colonoscopy for similar presentation a year ago, likely this represents hemorrhoidal bleeding without BUN elevation however patient has had some dark black stools, will evaluate with endoscopy to assure the primary sources lower GI. -Continue Protonix 40 mg IV BID. -Clear liquid diet, NPO at midnight. --Continue to monitor H&H with transfusion as needed to maintain hemoglobin greater than 7. -Plan for EGD tomoorrow due to lack of availability and anesthesia today. If EGD unremarkable, plan for general surgery consult.   I thoroughly discussed the procedure to include nature, alternatives, benefits, and risks including but not limited to bleeding, perforation, infection, anesthesia/cardiac and pulmonary complications. Patient provides understanding and gave verbal consent to proceed. -Continue symptomatic treatment of large hemorrhoids with Anusol and Proctofoam -Keep stools loose with MiraLAX twice  daily, Senokot -Pending iron and ferritin, will likely benefit from IV iron this hospitalization  Thank you for your kind consultation, we will continue to follow.         HPI:   Julie Dyer is a 61 y.o. female with past medical history significant for hypertension, glaucoma, chronic microcytic anemia with admission 2022 status post 3 PRBCs, symptomatic hemorrhoids, presented to ER with small vol hemorrhoidal bleeding X 1 week .   During admission 2022 for IDA had colonoscopy with Dr. Lyndel Safe with large internal /external hemorrhoids with ulceration.  Patient received 3 PRBCs during that admission.  She was referred to general surgery for prolapsed hemorrhoids however due to lack of insurance patient never went.  Presents yesterday to the ER with rectal bleeding for a week with presenting shortness of breath and fatigue, admitting hemoglobin 4.1, has received 3 units PRBC this admission, hemoglobin this morning 7.8.  Pressure baseline around 9 microcytosis 73 Iron and ferritin.  Patient BUN is 18, 13 and 6, no elevation of BUN.  Potassium 3.4.  Patient states that she was admitted last year she has continued to see intermittent bright red blood in her stool.  Has constipation with 2 bowel movements a week.  Was on MiraLAX previously but due to financial hardship has been off for at least 1 to 2 months, takes senna plus once every other week.  And Colace. Patient states the last 2 months she has had worsening bleeding with some dark black stool and black blood per rectum, after using MiraLAX and having bowel movement here just saw bright red blood. She states she continues to have painful hemorrhoids, can feel them prolapse will have to push them back in. Patient denies reflux,  dysphagia.  Does have clearing of her throat and some upper abdominal discomfort with specific foods.  Has nausea occasionally with movement otherwise denies nausea and vomiting. Patient denies NSAIDs, rare red wine had 3  glasses Saturday, she is not on outpatient iron. Patient did have breakfast tray in front of her this morning last drink at 8 AM. Patient lives with her daughter and fianc, due to her glaucoma she does not work or drive.  Has no insurance.  Is DNR.   Abnormal ED labs: Abnormal Labs Reviewed  CBC WITH DIFFERENTIAL/PLATELET - Abnormal; Notable for the following components:      Result Value   RBC 2.33 (*)    Hemoglobin 4.1 (*)    HCT 15.7 (*)    MCV 67.4 (*)    MCH 17.6 (*)    MCHC 26.1 (*)    RDW 19.3 (*)    All other components within normal limits  BASIC METABOLIC PANEL - Abnormal; Notable for the following components:   CO2 20 (*)    Glucose, Bld 110 (*)    All other components within normal limits  HEPATIC FUNCTION PANEL - Abnormal; Notable for the following components:   Total Protein 6.1 (*)    Alkaline Phosphatase 35 (*)    All other components within normal limits  RETICULOCYTES - Abnormal; Notable for the following components:   RBC. 2.47 (*)    Immature Retic Fract 30.6 (*)    All other components within normal limits  CBC - Abnormal; Notable for the following components:   RBC 3.45 (*)    Hemoglobin 7.8 (*)    HCT 25.2 (*)    MCV 73.0 (*)    MCH 22.6 (*)    RDW 21.7 (*)    All other components within normal limits  BASIC METABOLIC PANEL - Abnormal; Notable for the following components:   Potassium 3.4 (*)    Chloride 112 (*)    CO2 20 (*)    Calcium 8.6 (*)    All other components within normal limits  POC OCCULT BLOOD, ED - Abnormal; Notable for the following components:   Fecal Occult Bld POSITIVE (*)    All other components within normal limits     Past Medical History:  Diagnosis Date   Glaucoma     Surgical History:  She  has a past surgical history that includes Colonoscopy with propofol (N/A, 06/01/2021). Family History:  Her family history includes Alcohol abuse in her father and mother; Diabetes in her mother. Social History:   reports  that she has never smoked. She has never used smokeless tobacco. She reports that she does not currently use alcohol. She reports that she does not currently use drugs.  Prior to Admission medications   Medication Sig Start Date End Date Taking? Authorizing Provider  brimonidine (ALPHAGAN) 0.2 % ophthalmic solution Place 1 drop into the left eye 2 (two) times daily. 03/05/21  Yes [provider]  dibucaine (NUPERCAINAL) 1 % OINT Place 1 application rectally as needed for hemorrhoids (As needed for pain.  (no more than 30 grams of 1% ointment per day)). 06/01/21  Yes Rehman, Areeg N, DO  dorzolamide-timolol (COSOPT) 22.3-6.8 MG/ML ophthalmic solution Place 1 drop into the left eye 2 (two) times daily. 05/12/20  Yes [provider]  hydrocortisone (ANUSOL-HC) 2.5 % rectal cream Place 1 application rectally 2 (two) times daily. 07/30/21  Yes Georganna Skeans, MD  losartan (COZAAR) 100 MG tablet Take 1 tablet (100 mg total)  by mouth daily. 10/24/21  Yes Wilson, Amelia, MD  VYZULTA 0.024 % SOLN Place 1 drop into the left eye every evening. 04/27/20  Yes [provider]  senna-docusate (SENNA PLUS) 8.6-50 MG tablet Take 1 tablet by mouth daily. Patient not taking: Reported on 09/08/2022 07/30/21   Wilson, Amelia, MD    Current Facility-Administered Medications  Medication Dose Route Frequency Provider Last Rate Last Admin   0.9 %  sodium chloride infusion (Manually program via Guardrails IV Fluids)   Intravenous Once Yates, Jennifer, MD   Held at 09/08/22 1840   acetaminophen (TYLENOL) tablet 650 mg  650 mg Oral Q6H PRN Yates, Jennifer, MD       Or   acetaminophen (TYLENOL) suppository 650 mg  650 mg Rectal Q6H PRN Yates, Jennifer, MD       brimonidine (ALPHAGAN) 0.2 % ophthalmic solution 1 drop  1 drop Left Eye BID Yates, Jennifer, MD       dorzolamide-timolol (COSOPT) 2-0.5 % ophthalmic solution 1 drop  1 drop Left Eye BID Yates, Jennifer, MD       hydrALAZINE (APRESOLINE) injection  5 mg  5 mg Intravenous Q4H PRN Yates, Jennifer, MD       hydrocortisone (ANUSOL-HC) 2.5 % rectal cream 1 Application  1 Application Rectal BID Yates, Jennifer, MD       hydrocortisone-pramoxine (PROCTOFOAM-HC) rectal foam 1 applicator  1 applicator Rectal BID Yates, Jennifer, MD       lactated ringers infusion   Intravenous Continuous Yates, Jennifer, MD   Held at 09/08/22 1839   latanoprost (XALATAN) 0.005 % ophthalmic solution 1 drop  1 drop Left Eye QPM Yates, Jennifer, MD       losartan (COZAAR) tablet 100 mg  100 mg Oral Daily Yates, Jennifer, MD       ondansetron (ZOFRAN) tablet 4 mg  4 mg Oral Q6H PRN Yates, Jennifer, MD       Or   ondansetron (ZOFRAN) injection 4 mg  4 mg Intravenous Q6H PRN Yates, Jennifer, MD       pantoprazole (PROTONIX) injection 40 mg  40 mg Intravenous Q12H Yates, Jennifer, MD   40 mg at 09/08/22 2307   polyethylene glycol (MIRALAX / GLYCOLAX) packet 17 g  17 g Oral BID Yates, Jennifer, MD       senna-docusate (Senokot-S) tablet 2 tablet  2 tablet Oral Daily Yates, Jennifer, MD   2 tablet at 09/08/22 1844   Current Outpatient Medications  Medication Sig Dispense Refill   brimonidine (ALPHAGAN) 0.2 % ophthalmic solution Place 1 drop into the left eye 2 (two) times daily.     dibucaine (NUPERCAINAL) 1 % OINT Place 1 application rectally as needed for hemorrhoids (As needed for pain.  (no more than 30 grams of 1% ointment per day)). 28 g 0   dorzolamide-timolol (COSOPT) 22.3-6.8 MG/ML ophthalmic solution Place 1 drop into the left eye 2 (two) times daily.     hydrocortisone (ANUSOL-HC) 2.5 % rectal cream Place 1 application rectally 2 (two) times daily. 30 g 0   losartan (COZAAR) 100 MG tablet Take 1 tablet (100 mg total) by mouth daily. 90 tablet 1   VYZULTA 0.024 % SOLN Place 1 drop into the left eye every evening.     senna-docusate (SENNA PLUS) 8.6-50 MG tablet Take 1 tablet by mouth daily. (Patient not taking: Reported on 09/08/2022) 60 tablet 5    Allergies as  of 09/08/2022   (No Known Allergies)    Review of Systems:      Constitutional: No weight loss, fever, chills, weakness or fatigue HEENT: Eyes: No change in vision               Ears, Nose, Throat:  No change in hearing or congestion Skin: No rash or itching Cardiovascular: No chest pain, chest pressure or palpitations   Respiratory: No SOB or cough Gastrointestinal: See HPI and otherwise negative Genitourinary: No dysuria or change in urinary frequency Neurological: No headache, dizziness or syncope Musculoskeletal: No new muscle or joint pain Hematologic: No bleeding or bruising Psychiatric: No history of depression or anxiety     Physical Exam:  Vital signs in last 24 hours: Temp:  [97.6 F (36.4 C)-98.6 F (37 C)] 97.6 F (36.4 C) (11/06 0722) Pulse Rate:  [72-119] 85 (11/06 0715) Resp:  [14-28] 18 (11/06 0715) BP: (119-184)/(69-96) 169/84 (11/06 0715) SpO2:  [90 %-100 %] 100 % (11/06 0715) Weight:  [67.1 kg] 67.1 kg (11/05 1434) Last BM Date : 09/08/22 Last BM recorded by nurses in past 5 days No data recorded  General:   Pleasant, well developed female in no acute distress Head:  Normocephalic and atraumatic. Eyes: sclerae anicteric,conjunctive pink  Heart:  regular rate and rhythm Pulm: Clear anteriorly; no wheezing Abdomen:  Soft, Non-distended AB, Active bowel sounds. No tenderness . Without guarding and Without rebound, No organomegaly appreciated. Extremities:  Without edema. Rectal: declines had in ER Msk:  Symmetrical without gross deformities. Peripheral pulses intact.  Neurologic:  Alert and  oriented x4;  No focal deficits.  Skin:   Dry and intact without significant lesions or rashes. Psychiatric:  Cooperative. Normal mood and affect.  LAB RESULTS: Recent Labs    09/08/22 1433 09/09/22 0618  WBC 8.1 8.0  HGB 4.1* 7.8*  HCT 15.7* 25.2*  PLT PLATELET CLUMPS NOTED ON SMEAR, COUNT APPEARS ADEQUATE 185   BMET Recent Labs    09/08/22 1433  09/09/22 0618  NA 137 141  K 3.9 3.4*  CL 106 112*  CO2 20* 20*  GLUCOSE 110* 93  BUN 13 6  CREATININE 0.96 0.75  CALCIUM 9.0 8.6*   LFT Recent Labs    09/08/22 1433  PROT 6.1*  ALBUMIN 3.6  AST 17  ALT 8  ALKPHOS 35*  BILITOT 1.1  BILIDIR 0.2  IBILI 0.9   PT/INR Recent Labs    09/09/22 0618  LABPROT 14.4  INR 1.1    STUDIES: No results found.   Doree Albee  09/09/2022, 8:06 AM  GI ATTENDING  History, laboratories, x-rays, prior endoscopy report reviewed.  Patient seen and examined.  Agree with comprehensive consultation note as outlined above.  We are asked to see the patient for recurrent significant anemia (likely iron deficiency with microcytic indices) and rectal bleeding.  Colonoscopy 1 year ago with large ulcerated prolapsing hemorrhoids not amenable to medical therapy or an office banding.  Suspect problems with bleeding and recurrent anemia due to the same.  We will perform upper endoscopy to exclude other concurrent pathology.  If negative, recommend surgical consultation with colorectal surgeon for definitive treatment of hemorrhoids.  Endoscopy scheduled for tomorrow morning.  Wilhemina Bonito. Eda Keys., M.D. [facility] Division of Gastroenterology

## 2022-09-09 NOTE — ED Notes (Addendum)
Lunch tray provided. Continue to await medications from pharmacy.

## 2022-09-10 ENCOUNTER — Inpatient Hospital Stay (HOSPITAL_COMMUNITY): Payer: Self-pay | Admitting: Anesthesiology

## 2022-09-10 ENCOUNTER — Encounter (HOSPITAL_COMMUNITY)
Admission: EM | Disposition: A | Discharge status: Home / Self Care | Payer: Self-pay | Source: Home / Self Care | Attending: Internal Medicine

## 2022-09-10 ENCOUNTER — Encounter (HOSPITAL_COMMUNITY): Payer: Self-pay | Admitting: Internal Medicine

## 2022-09-10 DIAGNOSIS — K449 Diaphragmatic hernia without obstruction or gangrene: Secondary | ICD-10-CM

## 2022-09-10 DIAGNOSIS — K649 Unspecified hemorrhoids: Secondary | ICD-10-CM

## 2022-09-10 DIAGNOSIS — I10 Essential (primary) hypertension: Secondary | ICD-10-CM

## 2022-09-10 DIAGNOSIS — D509 Iron deficiency anemia, unspecified: Secondary | ICD-10-CM

## 2022-09-10 HISTORY — PX: ESOPHAGOGASTRODUODENOSCOPY (EGD) WITH PROPOFOL: SHX5813

## 2022-09-10 LAB — CBC WITH DIFFERENTIAL/PLATELET
Abs Immature Granulocytes: 0.04 10*3/uL (ref 0.00–0.07)
Basophils Absolute: 0 10*3/uL (ref 0.0–0.1)
Basophils Relative: 0 %
Eosinophils Absolute: 0.1 10*3/uL (ref 0.0–0.5)
Eosinophils Relative: 2 %
HCT: 28.4 % — ABNORMAL LOW (ref 36.0–46.0)
Hemoglobin: 8.6 g/dL — ABNORMAL LOW (ref 12.0–15.0)
Immature Granulocytes: 1 %
Lymphocytes Relative: 26 %
Lymphs Abs: 2 10*3/uL (ref 0.7–4.0)
MCH: 22.2 pg — ABNORMAL LOW (ref 26.0–34.0)
MCHC: 30.3 g/dL (ref 30.0–36.0)
MCV: 73.2 fL — ABNORMAL LOW (ref 80.0–100.0)
Monocytes Absolute: 0.9 10*3/uL (ref 0.1–1.0)
Monocytes Relative: 12 %
Neutro Abs: 4.4 10*3/uL (ref 1.7–7.7)
Neutrophils Relative %: 59 %
Platelets: 203 10*3/uL (ref 150–400)
RBC: 3.88 MIL/uL (ref 3.87–5.11)
RDW: 22.4 % — ABNORMAL HIGH (ref 11.5–15.5)
WBC: 7.5 10*3/uL (ref 4.0–10.5)
nRBC: 0.4 % — ABNORMAL HIGH (ref 0.0–0.2)

## 2022-09-10 LAB — BASIC METABOLIC PANEL
Anion gap: 9 (ref 5–15)
BUN: <5 mg/dL — ABNORMAL LOW (ref 6–20)
CO2: 21 mmol/L — ABNORMAL LOW (ref 22–32)
Calcium: 9.1 mg/dL (ref 8.9–10.3)
Chloride: 112 mmol/L — ABNORMAL HIGH (ref 98–111)
Creatinine, Ser: 0.8 mg/dL (ref 0.44–1.00)
GFR, Estimated: >60 mL/min (ref >60)
Glucose, Bld: 104 mg/dL — ABNORMAL HIGH (ref 70–99)
Potassium: 3.7 mmol/L (ref 3.5–5.1)
Sodium: 142 mmol/L (ref 135–145)

## 2022-09-10 LAB — SURGICAL PCR SCREEN
MRSA, PCR: NEGATIVE
Staphylococcus aureus: NEGATIVE

## 2022-09-10 LAB — MAGNESIUM: Magnesium: 2.1 mg/dL (ref 1.7–2.4)

## 2022-09-10 LAB — BRAIN NATRIURETIC PEPTIDE: B Natriuretic Peptide: 161.4 pg/mL — ABNORMAL HIGH (ref 0.0–100.0)

## 2022-09-10 SURGERY — ESOPHAGOGASTRODUODENOSCOPY (EGD) WITH PROPOFOL
Anesthesia: Monitor Anesthesia Care

## 2022-09-10 MED ORDER — PANTOPRAZOLE SODIUM 40 MG IV SOLR
40.0000 mg | Freq: Two times a day (BID) | INTRAVENOUS | Status: DC
  Administered 2022-09-10: 40 mg via INTRAVENOUS
  Filled 2022-09-10: qty 10

## 2022-09-10 MED ORDER — MUPIROCIN 2 % EX OINT
1.0000 | TOPICAL_OINTMENT | Freq: Two times a day (BID) | CUTANEOUS | Status: DC
  Administered 2022-09-10 – 2022-09-12 (×4): 1 via NASAL

## 2022-09-10 MED ORDER — LACTATED RINGERS IV SOLN: INTRAVENOUS | Status: DC | PRN

## 2022-09-10 MED ORDER — PROPOFOL 10 MG/ML IV BOLUS
INTRAVENOUS | Status: DC | PRN
  Administered 2022-09-10: 60 mg via INTRAVENOUS
  Administered 2022-09-10: 40 mg via INTRAVENOUS

## 2022-09-10 MED ORDER — POTASSIUM CHLORIDE CRYS ER 20 MEQ PO TBCR
10.0000 meq | EXTENDED_RELEASE_TABLET | Freq: Once | ORAL | Status: AC
  Administered 2022-09-10: 10 meq via ORAL
  Filled 2022-09-10: qty 1

## 2022-09-10 MED ORDER — ADULT MULTIVITAMIN W/MINERALS CH
1.0000 | ORAL_TABLET | Freq: Every day | ORAL | Status: DC
  Administered 2022-09-10 – 2022-09-12 (×3): 1 via ORAL
  Filled 2022-09-10 (×3): qty 1

## 2022-09-10 MED ORDER — MUPIROCIN 2 % EX OINT
1.0000 | TOPICAL_OINTMENT | Freq: Two times a day (BID) | CUTANEOUS | Status: DC
  Administered 2022-09-10 – 2022-09-12 (×2): 1 via NASAL
  Filled 2022-09-10 (×2): qty 22

## 2022-09-10 MED ORDER — PROPOFOL 500 MG/50ML IV EMUL
INTRAVENOUS | Status: DC | PRN
  Administered 2022-09-10: 75 ug/kg/min via INTRAVENOUS

## 2022-09-10 MED ORDER — PANTOPRAZOLE SODIUM 40 MG PO TBEC
40.0000 mg | DELAYED_RELEASE_TABLET | Freq: Every day | ORAL | Status: DC
  Administered 2022-09-11 – 2022-09-12 (×2): 40 mg via ORAL
  Filled 2022-09-10 (×2): qty 1

## 2022-09-10 MED ORDER — ENSURE MAX PROTEIN PO LIQD
11.0000 [oz_av] | Freq: Two times a day (BID) | ORAL | Status: DC
  Administered 2022-09-10 – 2022-09-12 (×3): 11 [oz_av] via ORAL
  Filled 2022-09-10 (×5): qty 330

## 2022-09-10 MED ORDER — POTASSIUM CHLORIDE CRYS ER 20 MEQ PO TBCR: 40.0000 meq | EXTENDED_RELEASE_TABLET | Freq: Once | ORAL | Status: DC

## 2022-09-10 MED ORDER — LIDOCAINE 2% (20 MG/ML) 5 ML SYRINGE
INTRAMUSCULAR | Status: DC | PRN
  Administered 2022-09-10: 80 mg via INTRAVENOUS

## 2022-09-10 SURGICAL SUPPLY — 15 items

## 2022-09-10 NOTE — Progress Notes (Signed)
PROGRESS NOTE                                                                                                                                                                                                             Patient Demographics:    Julie Dyer, is a 61 y.o. female, DOB - May 03, 1961, BPZ:[id number]  Outpatient Primary MD for the patient is Georganna Skeans, MD    LOS - 1  Admit date - 09/08/2022    Chief Complaint  Patient presents with   Rectal Bleeding       Brief Narrative (HPI from H&P)   61 y.o. female with medical history significant of glaucoma presenting with rectal bleeding. She was previously hospitalized in 7/22 with bleeding hemorrhoids; colonoscopy showed large prolapsed hemorrhoids and she was transfused and discharged.  She has severe glaucoma and cannot work so she has no insurance.  As a result, she never followed up with surgery as recommended.  She started with severe bleeding again last Wednesday and became SOB and fatigued with exertion Friday, in the ER she was found to have profound anemia due to lower GI blood loss and was admitted to the hospital.   Subjective:   Patient in bed appears to be in no distress denies any headache chest or abdominal pain, no nausea vomiting, minimal bleeding with bowel movements.   Assessment  & Plan :    Symptomatic acute lower GI bleed related anemia in a patient with known history of bleeding hemorrhoids. She has known history of bleeding hemorrhoids however could not follow-up outpatient due to lack of insurance, currently she is s/p 3 units of packed RBC transfusion, posttransfusion H&H stable, placed on stool softeners, oral iron and folic acid, and by GI underwent unremarkable EGD to rule out upper GI bleed on 09/10/2022, oral PPI.  Per GI will request general surgery to evaluate the patient here as this is her second admission for bleeding hemorrhoids, she  does not follow outpatient due to insurance issues.  Will wait for general surgery input.   Glaucoma.  Continue home regimen of brimonidine, Cosopt, and Vyzulta .  Hypertension.  On Cozaar.  Hypokalemia.  Replaced.       Condition - Fair  Family Communication  :  None  Code Status :  Full  Consults  :  GI, CCS  PUD Prophylaxis : PPI   Procedures  :           Disposition Plan  :    Status is: Inpatient   DVT Prophylaxis  :    SCDs Start: 09/08/22 1727   Lab Results  Component Value Date   PLT 185 09/09/2022    Diet :  Diet Order             Diet clear liquid Room service appropriate? Yes; Fluid consistency: Thin  Diet effective now                    Inpatient Medications  Scheduled Meds:  sodium chloride   Intravenous Once   brimonidine  1 drop Left Eye BID   dorzolamide-timolol  1 drop Left Eye BID   ferrous sulfate  325 mg Oral Q breakfast   folic acid  1 mg Oral Daily   hydrocortisone  1 Application Rectal BID   hydrocortisone-pramoxine  1 applicator Rectal BID   latanoprost  1 drop Left Eye QPM   losartan  100 mg Oral Daily   pantoprazole (PROTONIX) IV  40 mg Intravenous Q12H   polyethylene glycol  17 g Oral BID   potassium chloride  40 mEq Oral Once   senna-docusate  2 tablet Oral Daily   Continuous Infusions:  lactated ringers Stopped (09/08/22 1839)   PRN Meds:.acetaminophen **OR** acetaminophen, hydrALAZINE, ondansetron **OR** ondansetron (ZOFRAN) IV  Antibiotics  :    Anti-infectives (From admission, onward)    None         Objective:   Vitals:   09/09/22 0715 09/09/22 0722 09/09/22 0800 09/09/22 0845  BP: (!) 169/84  (!) 151/78 (!) 153/77  Pulse: 85  79 94  Resp: 18  17 19   Temp:  97.6 F (36.4 C)    TempSrc:  Oral    SpO2: 100%  100% 100%  Weight:      Height:        Wt Readings from Last 3 Encounters:  09/08/22 67.1 kg  10/24/21 73.8 kg  07/30/21 69.8 kg     Intake/Output Summary (Last 24 hours)  at 09/09/2022 0902 Last data filed at 09/09/2022 0258 Gross per 24 hour  Intake 1639.95 ml  Output --  Net 1639.95 ml     Physical Exam  Awake Alert, No new F.N deficits, Normal affect [place].AT,PERRAL Supple Neck, No JVD,   Symmetrical Chest wall movement, Good air movement bilaterally, CTAB RRR,No Gallops, Rubs or new Murmurs,  +ve B.Sounds, Abd Soft, No tenderness,   No Cyanosis, Clubbing or edema      Data Review:    CBC Recent Labs  Lab 09/08/22 1433 09/09/22 0618  WBC 8.1 8.0  HGB 4.1* 7.8*  HCT 15.7* 25.2*  PLT PLATELET CLUMPS NOTED ON SMEAR, COUNT APPEARS ADEQUATE 185  MCV 67.4* 73.0*  MCH 17.6* 22.6*  MCHC 26.1* 31.0  RDW 19.3* 21.7*  LYMPHSABS 1.2  --   MONOABS 0.6  --   EOSABS 0.0  --   BASOSABS 0.0  --     Electrolytes Recent Labs  Lab 09/08/22 1433 09/09/22 0618  NA 137 141  K 3.9 3.4*  CL 106 112*  CO2 20* 20*  GLUCOSE 110* 93  BUN 13 6  CREATININE 0.96 0.75  CALCIUM 9.0 8.6*  AST 17  --   ALT 8  --   ALKPHOS 35*  --   BILITOT 1.1  --   ALBUMIN 3.6  --  MG  --  2.1  INR  --  1.1     Radiology Reports No results found.    Signature  Lala Lund M.D on 09/09/2022 at 9:02 AM   -  To page go to [email or web address]

## 2022-09-10 NOTE — Transfer of Care (Signed)
Immediate Anesthesia Transfer of Care Note  Patient: Julie Dyer  Procedure(s) Performed: ESOPHAGOGASTRODUODENOSCOPY (EGD) WITH PROPOFOL  Patient Location: PACU and Endoscopy Unit  Anesthesia Type:MAC  Level of Consciousness: awake and patient cooperative  Airway & Oxygen Therapy: Patient Spontanous Breathing and Patient connected to nasal cannula oxygen  Post-op Assessment: Report given to RN and Post -op Vital signs reviewed and stable  Post vital signs: Reviewed and stable  Last Vitals:  Vitals Value Taken Time  BP    Temp    Pulse 63 09/10/22 0922  Resp 18 09/10/22 0922  SpO2 98 % 09/10/22 0922    Last Pain:  Vitals:   09/10/22 0756  TempSrc: Oral  PainSc: 0-No pain      Patients Stated Pain Goal: 0 (64/68/03 2122)  Complications: No notable events documented.

## 2022-09-10 NOTE — Interval H&P Note (Signed)
History and Physical Interval Note:  09/10/2022 8:58 AM  [name]  has presented today for surgery, with the diagnosis of Melena, anemia.  The various methods of treatment have been discussed with the patient and family. After consideration of risks, benefits and other options for treatment, the patient has consented to  Procedure(s): ESOPHAGOGASTRODUODENOSCOPY (EGD) WITH PROPOFOL (N/A) as a surgical intervention.  The patient's history has been reviewed, patient examined, no change in status, stable for surgery.  I have reviewed the patient's chart and labs.  Questions were answered to the patient's satisfaction.     Julie Dyer

## 2022-09-10 NOTE — Progress Notes (Signed)
Initial Nutrition Assessment  DOCUMENTATION CODES:   Non-severe (moderate) malnutrition in context of chronic illness  INTERVENTION:  - Regular diet - Ensure Max po BID, each supplement provides 150 kcal and 30 grams of protein.  - Daily multivitamin with minerals to support micronutrient needs - Folic acid per MD - Outpatient RD referral    NUTRITION DIAGNOSIS:   Moderate Malnutrition related to chronic illness, other (see comment) (limited food choices) as evidenced by mild fat depletion, mild muscle depletion, energy intake < or equal to 75% for > or equal to 1 month.  GOAL:   Patient will meet greater than or equal to 90% of their needs  MONITOR:   Supplement acceptance, PO intake, Weight trends  REASON FOR ASSESSMENT:   Malnutrition Screening Tool    ASSESSMENT:   61 y.o. female with PMH Glaucoma who presented with rectal bleeding.  Met with patient and daughter at bedside. Patient reports a UBW of 148# and weight loss since the summer time. Admits her weight has fluctuated but recently has down trended due to ongoing issues with hemorrhoids. No weight available since December 2022, when patient was weighed at 162#. However, patient does not feel this was an accurate weight so weight trends in the past year difficult to assess at this time. Patient admits to a very limited diet due to her hemorrhoids as she is afraid of how different foods with affect her. She also experiences constipation and GI note indicates patient has BMs only every 2 weeks.  Food recall collected and patient reports typically consuming only 1 meal a day plus snacks, mostly consisting of: [place], chicken, a few different vegetables (did not specify), and yogurt with lemon juice.  She notes she avoids several different foods, most notably bread. Encouraged patient to keep food journal to document foods that upset her/do not upset her and the symptoms she experiences.   Patient has tried nutrition  supplements in the past and not minded them but admits she has not consumed them on a regular basis.   Patient endorses her current appetite is good as she hadn't eaten for a few days PTA. Agreeable to try nutrition supplements but only one lower in calories and higher in protein. She is also agreeable to receive a multivitamin due to limited diet. Patient and daughter appreciative of information. Patient interested in referral to outpatient dietitian.   Medications reviewed and include: Folic acid, Colace, Miralax, Senokot  Labs reviewed:  -   NUTRITION - FOCUSED PHYSICAL EXAM:  Flowsheet Row Most Recent Value  Orbital Region No depletion  Upper Arm Region Mild depletion  Thoracic and Lumbar Region Mild depletion  Buccal Region No depletion  Temple Region Mild depletion  Clavicle Bone Region Moderate depletion  Clavicle and Acromion Bone Region Moderate depletion  Scapular Bone Region Unable to assess  Dorsal Hand Mild depletion  Patellar Region Mild depletion  Anterior Thigh Region Mild depletion  Posterior Calf Region No depletion  Edema (RD Assessment) None  Hair Reviewed  Eyes Reviewed  Mouth Reviewed  Skin Reviewed  Nails Reviewed       Diet Order:   Diet Order             Diet regular Room service appropriate? Yes; Fluid consistency: Thin  Diet effective now                   EDUCATION NEEDS:   No education needs have been identified at this time  Skin:  Skin Assessment:  Reviewed RN Assessment  Last BM:  11/5  Height:  Ht Readings from Last 1 Encounters:  09/08/22 5' 5.5" (1.664 m)   Weight:  Wt Readings from Last 1 Encounters:  09/08/22 67.1 kg    BMI:  Body mass index is 24.25 kg/m.  Estimated Nutritional Needs:  Kcal:  1700-1900 Protein:  80-95 Fluid:  1800-1900    Samson Frederic RD, LDN For contact information, refer to [facility].

## 2022-09-10 NOTE — Op Note (Signed)
[facility] Patient Name: Julie Dyer Procedure Date : 09/10/2022 MRN: [id number] Attending MD: Docia Chuck. Henrene Pastor , MD, [id number] Date of Birth: 09-Mar-1961 CSN: [id number] Age: 61 Admit Type: Inpatient Procedure:                Upper GI endoscopy Indications:              Iron deficiency anemia, Heme positive stool.                            Chronic rectal bleeding due to hemorrhoids Providers:                Docia Chuck. Henrene Pastor, MD, Kary Kos RN, RN, Darliss Cheney,                            Technician, Cira Servant, CRNA Referring MD:             [facility] hospitalist Medicines:                Monitored Anesthesia Care Complications:            No immediate complications. Estimated Blood Loss:     Estimated blood loss: none. Procedure:                Pre-Anesthesia Assessment:                           - Prior to the procedure, a History and Physical                            was performed, and patient medications and                            allergies were reviewed. The patient's tolerance of                            previous anesthesia was also reviewed. The risks                            and benefits of the procedure and the sedation                            options and risks were discussed with the patient.                            All questions were answered, and informed consent                            was obtained. Prior Anticoagulants: The patient has                            taken no anticoagulant or antiplatelet agents. ASA                            Grade Assessment: II - A patient with mild systemic  disease. After reviewing the risks and benefits,                            the patient was deemed in satisfactory condition to                            undergo the procedure.                           After obtaining informed consent, the endoscope was                            passed under direct vision. Throughout the                             procedure, the patient's blood pressure, pulse, and                            oxygen saturations were monitored continuously. The                            [id number]-H190 ([id number] Olympus endoscope was introduced                            through the mouth, and advanced to the second part                            of duodenum. The upper GI endoscopy was                            accomplished without difficulty. The patient                            tolerated the procedure well. Scope In: Scope Out: Findings:      The esophagus was normal.      The stomach was normal. Small hiatal hernia.      The examined duodenum was normal.      The cardia and gastric fundus were normal on retroflexion. Impression:               1. Normal EGD                           2. Iron deficiency anemia secondary to chronic                            blood loss from large hemorrhoids. Recommendation:           1. Patient has a contact number available for                            emergencies. The signs and symptoms of potential                            delayed complications were discussed with the  patient. Return to normal activities tomorrow.                            Written discharge instructions were provided to the                            patient.                           2. Resume previous diet.                           3. Continue present medications.                           4. Please consult general surgery for                            hemorrhoidectomy. This is the patient's second                            admission for profound anemia secondary to large                            bleeding hemorrhoids amenable to medical for                            ligation therapy                           Discussed with the patient. Provided her a copy of                            this report. We will sign off Procedure Code(s):        ---  Professional ---                           [phone], Esophagogastroduodenoscopy, flexible,                            transoral; diagnostic, including collection of                            specimen(s) by brushing or washing, when performed                            (separate procedure) Diagnosis Code(s):        --- Professional ---                           D50.9, Iron deficiency anemia, unspecified                           R19.5, Other fecal abnormalities CPT copyright 2022 American Medical Association. All rights reserved. The codes documented in this report are preliminary and upon coder review may  be revised to meet current compliance requirements. Docia Chuck. Henrene Pastor,  MD 09/10/2022 9:27:51 AM This report has been signed electronically. Number of Addenda: 0

## 2022-09-10 NOTE — TOC Initial Note (Addendum)
Transition of Care [facility]) - Initial/Assessment Note    Patient Details  Name: Julie Dyer MRN: [id number] Date of Birth: 07/10/1961  [facility] CM/SW Contact:    Curlene Labrum, RN Phone Number: 09/10/2022, 11:24 AM  Clinical Narrative:                 CM met with the patient at the bedside to discuss transitions of care needs.  The patient was admitted for lower GI bleed and returned from EGD today and PRBC transfusion for low hemoglobin.    The patient placed a Medicaid application online 2 weeks ago and has applied for disability.  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  The patient lives with her daughter and fiance who are currently working.  PCP - Dr. Dorna Mai  Medication assistance will be provided through [place] prior to discharge since the patient has no insurance coverage.  CM will continue to follow the patient for discharge needs to home once medically stable.  The patient's family is able to provide transportation home by car.  Referral placed for [employer] to assist with Medicaid and disability process.  Expected Discharge Plan: Home/Self Care Barriers to Discharge: Continued Medical Work up   Patient Goals and CMS Choice Patient states their goals for this hospitalization and ongoing recovery are:: To return home CMS Medicare.gov Compare Post Acute Care list provided to:: Patient    Expected Discharge Plan and Services Expected Discharge Plan: Home/Self Care   Discharge Planning Services: CM Consult   Living arrangements for the past 2 months: Apartment                                      Prior Living Arrangements/Services Living arrangements for the past 2 months: Apartment Lives with:: Adult Children, Soil scientist (Lives with daughter and fiance at home) Patient language and need for interpreter reviewed:: Yes Do  you feel safe going back to the place where you live?: Yes      Need for Family Participation in Patient Care: Yes (Comment) Care giver support system in place?: Yes (comment)   Criminal Activity/Legal Involvement Pertinent to Current Situation/Hospitalization: No - Comment as needed  Activities of Daily Living Home Assistive Devices/Equipment: Eyeglasses ADL Screening (condition at time of admission) Patient's cognitive ability adequate to safely complete daily activities?: Yes Is the patient deaf or have difficulty hearing?: No Does the patient have difficulty seeing, even when wearing glasses/contacts?: Yes Does the patient have difficulty concentrating, remembering, or making decisions?: No Patient able to express need for assistance with ADLs?: Yes Does the patient have difficulty dressing or bathing?: No Independently performs ADLs?: Yes (appropriate for developmental age) Does the patient have difficulty walking or climbing stairs?: No Weakness of Legs: None Weakness of Arms/Hands: None  Permission Sought/Granted Permission sought to share information with : Case Manager, Family Supports                Emotional Assessment Appearance:: Appears stated age Attitude/Demeanor/Rapport: Gracious Affect (typically observed): Accepting Orientation: : Oriented to Self, Oriented to Place, Oriented to  Time, Oriented to Situation Alcohol / Substance Use: Not Applicable Psych Involvement: No (comment)  Admission diagnosis:  Rectal bleeding [K62.5] Acute lower GI bleeding [K92.2] Anemia, unspecified type [D64.9] Patient Active Problem List   Diagnosis Date Noted  Acute lower GI bleeding 09/08/2022   ABLA (acute blood loss anemia) 09/08/2022   Glaucoma (increased eye pressure) 09/08/2022   Essential hypertension 09/08/2022   DNR (do not resuscitate) 09/08/2022   Hemorrhoids    Constipation    Anemia 05/29/2021   PCP:  Dorna Mai, MD Pharmacy:   [id number]  [id number]: [phone]: [phone]    Social Determinants of Health (SDOH) Interventions    Readmission Risk Interventions    09/10/2022   11:23 AM  Readmission Risk Prevention Plan  Post Dischage Appt Complete  Medication Screening Complete  Transportation Screening Complete

## 2022-09-10 NOTE — Plan of Care (Signed)

## 2022-09-10 NOTE — Anesthesia Procedure Notes (Signed)
Procedure Name: MAC Date/Time: 09/10/2022 9:02 AM  Performed by: Lowella Dell, CRNAPre-anesthesia Checklist: Patient identified, Emergency Drugs available, Suction available, Patient being monitored and Timeout performed Patient Re-evaluated:Patient Re-evaluated prior to induction Oxygen Delivery Method: Nasal cannula Placement Confirmation: positive ETCO2 Dental Injury: Teeth and Oropharynx as per pre-operative assessment

## 2022-09-10 NOTE — Anesthesia Preprocedure Evaluation (Addendum)
Anesthesia Evaluation  Patient identified by MRN, date of birth, ID band Patient awake    Reviewed: Allergy & Precautions, NPO status , Patient's Chart, lab work & pertinent test results  History of Anesthesia Complications Negative for: history of anesthetic complications  Airway Mallampati: II  TM Distance: >3 FB Neck ROM: Full    Dental  (+) Dental Advisory Given   Pulmonary neg pulmonary ROS   breath sounds clear to auscultation       Cardiovascular hypertension, Pt. on medications (-) angina  Rhythm:Regular Rate:Normal     Neuro/Psych glaucoma    GI/Hepatic negative GI ROS, Neg liver ROS,,,  Endo/Other  negative endocrine ROS    Renal/GU negative Renal ROS     Musculoskeletal   Abdominal   Peds  Hematology  (+) Blood dyscrasia (Hb 8.6), anemia   Anesthesia Other Findings   Reproductive/Obstetrics                             Anesthesia Physical Anesthesia Plan  ASA: 2  Anesthesia Plan: MAC   Post-op Pain Management: Minimal or no pain anticipated   Induction:   PONV Risk Score and Plan: 2 and Ondansetron and Treatment may vary due to age or medical condition  Airway Management Planned: Natural Airway and Nasal Cannula  Additional Equipment: None  Intra-op Plan:   Post-operative Plan:   Informed Consent: I have reviewed the patients History and Physical, chart, labs and discussed the procedure including the risks, benefits and alternatives for the proposed anesthesia with the patient or authorized representative who has indicated his/her understanding and acceptance.   Patient has DNR.  Discussed DNR with patient and Suspend DNR.   Dental advisory given  Plan Discussed with: CRNA and Surgeon  Anesthesia Plan Comments:         Anesthesia Quick Evaluation

## 2022-09-10 NOTE — Anesthesia Postprocedure Evaluation (Signed)
Anesthesia Post Note  Patient: Julie Dyer  Procedure(s) Performed: ESOPHAGOGASTRODUODENOSCOPY (EGD) WITH PROPOFOL     Patient location during evaluation: Endoscopy Anesthesia Type: MAC Level of consciousness: awake and alert, patient cooperative and oriented Pain management: pain level controlled Vital Signs Assessment: post-procedure vital signs reviewed and stable Respiratory status: nonlabored ventilation, spontaneous breathing and respiratory function stable Cardiovascular status: blood pressure returned to baseline and stable Postop Assessment: no apparent nausea or vomiting Anesthetic complications: no   No notable events documented.  Last Vitals:  Vitals:   09/10/22 0926 09/10/22 0930  BP: 97/64 103/66  Pulse: 64 63  Resp: 15 16  Temp:    SpO2: 100% 100%    Last Pain:  Vitals:   09/10/22 0930  TempSrc:   PainSc: 0-No pain                 Arie Gable,E. Xareni Kelch

## 2022-09-11 ENCOUNTER — Encounter (HOSPITAL_COMMUNITY): Payer: Self-pay | Admitting: Internal Medicine

## 2022-09-11 LAB — BASIC METABOLIC PANEL
Anion gap: 7 (ref 5–15)
BUN: 11 mg/dL (ref 6–20)
CO2: 25 mmol/L (ref 22–32)
Calcium: 8.9 mg/dL (ref 8.9–10.3)
Chloride: 108 mmol/L (ref 98–111)
Creatinine, Ser: 0.84 mg/dL (ref 0.44–1.00)
GFR, Estimated: >60 mL/min (ref >60)
Glucose, Bld: 100 mg/dL — ABNORMAL HIGH (ref 70–99)
Potassium: 3.9 mmol/L (ref 3.5–5.1)
Sodium: 140 mmol/L (ref 135–145)

## 2022-09-11 LAB — CBC WITH DIFFERENTIAL/PLATELET
Abs Immature Granulocytes: 0.02 10*3/uL (ref 0.00–0.07)
Basophils Absolute: 0 10*3/uL (ref 0.0–0.1)
Basophils Relative: 1 %
Eosinophils Absolute: 0.1 10*3/uL (ref 0.0–0.5)
Eosinophils Relative: 2 %
HCT: 24.9 % — ABNORMAL LOW (ref 36.0–46.0)
Hemoglobin: 7.6 g/dL — ABNORMAL LOW (ref 12.0–15.0)
Immature Granulocytes: 0 %
Lymphocytes Relative: 27 %
Lymphs Abs: 1.5 10*3/uL (ref 0.7–4.0)
MCH: 22.6 pg — ABNORMAL LOW (ref 26.0–34.0)
MCHC: 30.5 g/dL (ref 30.0–36.0)
MCV: 74.1 fL — ABNORMAL LOW (ref 80.0–100.0)
Monocytes Absolute: 0.8 10*3/uL (ref 0.1–1.0)
Monocytes Relative: 14 %
Neutro Abs: 3.2 10*3/uL (ref 1.7–7.7)
Neutrophils Relative %: 56 %
Platelets: 188 10*3/uL (ref 150–400)
RBC: 3.36 MIL/uL — ABNORMAL LOW (ref 3.87–5.11)
RDW: 23.2 % — ABNORMAL HIGH (ref 11.5–15.5)
WBC: 5.7 10*3/uL (ref 4.0–10.5)
nRBC: 0 % (ref 0.0–0.2)

## 2022-09-11 LAB — MAGNESIUM: Magnesium: 2.2 mg/dL (ref 1.7–2.4)

## 2022-09-11 LAB — BRAIN NATRIURETIC PEPTIDE: B Natriuretic Peptide: 73.1 pg/mL (ref 0.0–100.0)

## 2022-09-11 MED ORDER — HYDROCORTISONE ACETATE 25 MG RE SUPP: 25.0000 mg | Freq: Two times a day (BID) | RECTAL | Status: DC

## 2022-09-11 MED ORDER — LIDOCAINE HCL URETHRAL/MUCOSAL 2 % EX GEL
1.0000 | Freq: Once | CUTANEOUS | Status: AC
  Administered 2022-09-11: 1 via TOPICAL
  Filled 2022-09-11: qty 6

## 2022-09-11 MED ORDER — HYDROCORTISONE (PERIANAL) 2.5 % EX CREA
TOPICAL_CREAM | Freq: Three times a day (TID) | CUTANEOUS | Status: DC
  Filled 2022-09-11: qty 28.35

## 2022-09-11 NOTE — Progress Notes (Signed)
Mobility Specialist Progress Note:   09/11/22 1023  Mobility  Activity Ambulated independently in room  Level of Assistance Independent  Assistive Device None  Distance Ambulated (ft) 50 ft  Activity Response Tolerated well  Mobility Referral Yes  $Mobility charge 1 Mobility   Pt received in bed and agreeable. C/o pain and discomfort. Pt deferring further mobility d/t pain. Pt left in bed with all needs met and call bell in reach.   Elleen Coulibaly Mobility Specialist-Acute Rehab Secure Chat only

## 2022-09-11 NOTE — Progress Notes (Signed)
PROGRESS NOTE                                                                                                                                                                                                             Patient Demographics:    Julie Newgent, is a 61 y.o. female, DOB - 29-May-1961, DJT:[id number]  Outpatient Primary MD for the patient is Georganna Skeans, MD    LOS - 3  Admit date - 09/08/2022    Chief Complaint  Patient presents with   Rectal Bleeding       Brief Narrative (HPI from H&P)   61 y.o. female with medical history significant of glaucoma presenting with rectal bleeding. She was previously hospitalized in 7/22 with bleeding hemorrhoids; colonoscopy showed large prolapsed hemorrhoids and she was transfused and discharged.  She has severe glaucoma and cannot work so she has no insurance.  As a result, she never followed up with surgery as recommended.  She started with severe bleeding again last Wednesday and became SOB and fatigued with exertion Friday, in the ER she was found to have profound anemia due to lower GI blood loss and was admitted to the hospital.   Subjective:   In bed denies any headache chest pain, no nausea vomiting, still having some hard stools with some hemorrhoidal bleeding.   Assessment  & Plan :    Symptomatic acute lower GI bleed related anemia in a patient with known history of bleeding hemorrhoids. She has known history of bleeding hemorrhoids however could not follow-up outpatient due to lack of insurance, currently she is s/p 3 units of packed RBC transfusion, posttransfusion H&H stable, placed on stool softeners, oral iron and folic acid, and by GI underwent unremarkable EGD to rule out upper GI bleed on 09/10/2022, oral PPI.  Per GI will request general surgery to evaluate the patient here as this is her second admission for bleeding hemorrhoids, she does not follow outpatient  due to insurance issues.  Will wait for general surgery input.   Glaucoma.  Continue home regimen of brimonidine, Cosopt, and Vyzulta .  Hypertension.  On Cozaar.  Hypokalemia.  Replaced.       Condition - Fair  Family Communication  :  None  Code Status :  Full  Consults  :  GI, CCS  PUD Prophylaxis : PPI  Procedures  :     EGD -   Impression:               1. Normal EGD. 2. 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  Recommendation:             1. Patient has a contact number available for emergencies. The signs and symptoms of potential delayed complications were discussed with the patient. Return to normal activities tomorrow. Written discharge instructions were provided to the patient. 2. Resume previous diet. 3. Continue present medications. 4. Please consult general surgery for hemorrhoidectomy. This is the patient's second admission for profound anemia secondary to large bleeding hemorrhoids amenable to medical for ligation therapy.  Discussed with the patient. Provided her a copy of this report. We will sign off      Disposition Plan  :    Status is: Inpatient   DVT Prophylaxis  :    SCDs Start: 09/08/22 1727   Lab Results  Component Value Date   PLT 188 09/11/2022    Diet :  Diet Order             Diet regular Room service appropriate? Yes; Fluid consistency: Thin  Diet effective now                    Inpatient Medications  Scheduled Meds:  sodium chloride   Intravenous Once   brimonidine  1 drop Left Eye BID   docusate sodium  200 mg Oral BID   dorzolamide-timolol  1 drop Left Eye BID   ferrous sulfate  325 mg Oral Q breakfast   folic acid  1 mg Oral Daily   hydrocortisone  1 Application Rectal BID   hydrocortisone-pramoxine  1 applicator Rectal BID   latanoprost  1 drop Left Eye QPM   losartan  100 mg Oral Daily   multivitamin with minerals  1 tablet Oral Daily   mupirocin ointment  1  Application Nasal BID   mupirocin ointment  1 Application Nasal BID   pantoprazole  40 mg Oral Daily   polyethylene glycol  17 g Oral BID   Ensure Max Protein  11 oz Oral BID   senna-docusate  2 tablet Oral Daily   Continuous Infusions:   PRN Meds:.acetaminophen **OR** acetaminophen, hydrALAZINE, ondansetron **OR** ondansetron (ZOFRAN) IV, mouth rinse  Antibiotics  :    Anti-infectives (From admission, onward)    None         Objective:   Vitals:   09/10/22 1237 09/10/22 1629 09/10/22 1915 09/11/22 0729  BP: (!) 107/96 (!) 103/58 134/76 130/86  Pulse: 83 84 83 78  Resp: 18 18 15 15   Temp: 98 F (36.7 C) 98.2 F (36.8 C) 98.2 F (36.8 C) 98.7 F (37.1 C)  TempSrc: Oral Oral Oral Oral  SpO2: 100% 100% 100% 99%  Weight:      Height:        Wt Readings from Last 3 Encounters:  09/08/22 67.1 kg  10/24/21 73.8 kg  07/30/21 69.8 kg     Intake/Output Summary (Last 24 hours) at 09/11/2022 0835 Last data filed at 09/10/2022 2140 Gross per 24 hour  Intake 560 ml  Output --  Net 560 ml     Physical Exam  Awake Alert, No new F.N deficits, Normal affect [place].AT,PERRAL Supple Neck, No JVD,   Symmetrical Chest wall movement, Good air movement bilaterally, CTAB RRR,No Gallops, Rubs or new Murmurs,  +ve B.Sounds,  Abd Soft, No tenderness,   No Cyanosis, Clubbing or edema      Data Review:    CBC Recent Labs  Lab 09/08/22 1433 09/09/22 0618 09/10/22 0312 09/11/22 0342  WBC 8.1 8.0 7.5 5.7  HGB 4.1* 7.8* 8.6* 7.6*  HCT 15.7* 25.2* 28.4* 24.9*  PLT PLATELET CLUMPS NOTED ON SMEAR, COUNT APPEARS ADEQUATE 185 203 188  MCV 67.4* 73.0* 73.2* 74.1*  MCH 17.6* 22.6* 22.2* 22.6*  MCHC 26.1* 31.0 30.3 30.5  RDW 19.3* 21.7* 22.4* 23.2*  LYMPHSABS 1.2  --  2.0 1.5  MONOABS 0.6  --  0.9 0.8  EOSABS 0.0  --  0.1 0.1  BASOSABS 0.0  --  0.0 0.0    Electrolytes Recent Labs  Lab 09/08/22 1433 09/09/22 0618 09/10/22 0312 09/11/22 0342  NA 137 141 142 140  K 3.9  3.4* 3.7 3.9  CL 106 112* 112* 108  CO2 20* 20* 21* 25  GLUCOSE 110* 93 104* 100*  BUN 13 6 <5* 11  CREATININE 0.96 0.75 0.80 0.84  CALCIUM 9.0 8.6* 9.1 8.9  AST 17  --   --   --   ALT 8  --   --   --   ALKPHOS 35*  --   --   --   BILITOT 1.1  --   --   --   ALBUMIN 3.6  --   --   --   MG  --  2.1 2.1 2.2  INR  --  1.1  --   --   BNP  --   --  161.4* 73.1     Radiology Reports No results found.    Signature  Susa Raring M.D on 09/11/2022 at 8:35 AM   -  To page go to [email or web address]

## 2022-09-11 NOTE — Consult Note (Signed)
Julie Dyer 1961/04/04  [id number].    Requesting MD: Dr. Susa Raring Chief Complaint/Reason for Consult: hemorrhoids  HPI:  This is a 61 yo black female with a history of glaucoma and hemorrhoids.  She presented to the ED in July of this year secondary to ABL anemia from hemorrhoids.  She was started on treatment at that time and after transfusions was able to be discharged with follow up in our office with a colorectal surgeon.  She went to our office several days prior to her appointment but could not be seen at that time given it was not her appointment time.  She decided to not pursue further follow up and never went back.  She took her anusol right after discharge but after it ran out never sought follow up for refill or renewal.  She has been using preparation H at home.  These did improve some during the interim, but returned with further bleeding again recently.  She has still be unable to find a job to [employer] and therefore has not sought treatment.  Unfortunately, she began bleeding again recently and developed SOB and fatigue.  Her presenting hgb was 4 and she has been transfused.  Her bleeding has slowed down and is with BMs but resolves quickly.  She underwent an endoscopy here which was unremarkable.  Her last c-scope was in July of this year during her last admission.  She has been placed on anusol cream and proctofoam.  We have been asked to see her for further recommendations.  ROS: ROS: see HPI  Family History  Problem Relation Age of Onset   Diabetes Mother    Alcohol abuse Mother    Alcohol abuse Father     Past Medical History:  Diagnosis Date   Glaucoma    Hemorrhoids     Past Surgical History:  Procedure Laterality Date   COLONOSCOPY WITH PROPOFOL N/A 06/01/2021   Procedure: COLONOSCOPY WITH PROPOFOL;  Surgeon: Lynann Bologna, MD;  Location: [facility] ENDOSCOPY;  Service: Endoscopy;  Laterality: N/A;   GLAUCOMA SURGERY Left     Social History:   reports that she has never smoked. She has never used smokeless tobacco. She reports that she does not currently use alcohol. She reports that she does not currently use drugs.  Allergies: No Known Allergies  Medications Prior to Admission  Medication Sig Dispense Refill   brimonidine (ALPHAGAN) 0.2 % ophthalmic solution Place 1 drop into the left eye 2 (two) times daily.     dibucaine (NUPERCAINAL) 1 % OINT Place 1 application rectally as needed for hemorrhoids (As needed for pain.  (no more than 30 grams of 1% ointment per day)). 28 g 0   dorzolamide-timolol (COSOPT) 22.3-6.8 MG/ML ophthalmic solution Place 1 drop into the left eye 2 (two) times daily.     hydrocortisone (ANUSOL-HC) 2.5 % rectal cream Place 1 application rectally 2 (two) times daily. 30 g 0   losartan (COZAAR) 100 MG tablet Take 1 tablet (100 mg total) by mouth daily. 90 tablet 1   VYZULTA 0.024 % SOLN Place 1 drop into the left eye every evening.     senna-docusate (SENNA PLUS) 8.6-50 MG tablet Take 1 tablet by mouth daily. (Patient not taking: Reported on 09/08/2022) 60 tablet 5     Physical Exam: Blood pressure 130/86, pulse 78, temperature 98.7 F (37.1 C), temperature source Oral, resp. rate 15, height 5' 5.5" (1.664 m), weight 67.1 kg, SpO2 99 %. General: pleasant, WD, WN  female who is laying in bed in NAD HEENT: head is normocephalic, atraumatic.  Sclera are noninjected.  PERRL.  Ears and nose without any masses or lesions.  Mouth is pink and moist Lungs: Respiratory effort nonlabored Rectal: moderate to large hemorrhoids noted circumferentially.  No bleeding noted at the time.  Tender to palpation, but no evidence of thrombosis.  DRE deferred at this time secondary to pain. Psych: A&Ox3 with an appropriate affect.   Results for orders placed or performed during the hospital encounter of 09/08/22 (from the past 48 hour(s))  Magnesium     Status: None   Collection Time: 09/10/22  3:12 AM  Result Value Ref Range    Magnesium 2.1 1.7 - 2.4 mg/dL    Comment: Performed at [facility], [address], [place], [place] [address]  CBC with Differential/Platelet     Status: Abnormal   Collection Time: 09/10/22  3:12 AM  Result Value Ref Range   WBC 7.5 4.0 - 10.5 K/uL   RBC 3.88 3.87 - 5.11 MIL/uL   Hemoglobin 8.6 (L) 12.0 - 15.0 g/dL    Comment: Reticulocyte Hemoglobin testing may be clinically indicated, consider ordering this additional test [id number]    HCT 28.4 (L) 36.0 - 46.0 %   MCV 73.2 (L) 80.0 - 100.0 fL   MCH 22.2 (L) 26.0 - 34.0 pg   MCHC 30.3 30.0 - 36.0 g/dL   RDW 40.1 (H) 02.7 - 25.3 %   Platelets 203 150 - 400 K/uL    Comment: REPEATED TO VERIFY   nRBC 0.4 (H) 0.0 - 0.2 %   Neutrophils Relative % 59 %   Neutro Abs 4.4 1.7 - 7.7 K/uL   Lymphocytes Relative 26 %   Lymphs Abs 2.0 0.7 - 4.0 K/uL   Monocytes Relative 12 %   Monocytes Absolute 0.9 0.1 - 1.0 K/uL   Eosinophils Relative 2 %   Eosinophils Absolute 0.1 0.0 - 0.5 K/uL   Basophils Relative 0 %   Basophils Absolute 0.0 0.0 - 0.1 K/uL   Immature Granulocytes 1 %   Abs Immature Granulocytes 0.04 0.00 - 0.07 K/uL    Comment: Performed at [facility], [address], [place], [place] [address]  Brain natriuretic peptide     Status: Abnormal   Collection Time: 09/10/22  3:12 AM  Result Value Ref Range   B Natriuretic Peptide 161.4 (H) 0.0 - 100.0 pg/mL    Comment: Performed at [facility], [address] [place], [place] [address]  Basic metabolic panel     Status: Abnormal   Collection Time: 09/10/22  3:12 AM  Result Value Ref Range   Sodium 142 135 - 145 mmol/L   Potassium 3.7 3.5 - 5.1 mmol/L   Chloride 112 (H) 98 - 111 mmol/L   CO2 21 (L) 22 - 32 mmol/L   Glucose, Bld 104 (H) 70 - 99 mg/dL    Comment: Glucose reference range applies only to samples taken after fasting for at least 8 hours.   BUN <5 (L) 6 - 20 mg/dL   Creatinine, Ser 5.95 0.44 - 1.00 mg/dL   Calcium 9.1 8.9 - 63.8 mg/dL   GFR,  Estimated >75 >64 mL/min    Comment: (NOTE) Calculated using the CKD-EPI Creatinine Equation (2021)    Anion gap 9 5 - 15    Comment: Performed at [facility], [address] [place], [place] [address]  Surgical PCR screen  Status: None   Collection Time: 09/10/22  4:54 AM   Specimen: Nasal Mucosa; Nasal Swab  Result Value Ref Range   MRSA, PCR NEGATIVE NEGATIVE   Staphylococcus aureus NEGATIVE NEGATIVE    Comment: (NOTE) The Xpert SA Assay (FDA approved for NASAL specimens in patients 56 years of age and older), is one component of a comprehensive surveillance program. It is not intended to diagnose infection nor to guide or monitor treatment. Performed at [facility], [address] [place], [place] [address]   Magnesium     Status: None   Collection Time: 09/11/22  3:42 AM  Result Value Ref Range   Magnesium 2.2 1.7 - 2.4 mg/dL    Comment: Performed at [facility], [address], [place], [place] [address]  CBC with Differential/Platelet     Status: Abnormal   Collection Time: 09/11/22  3:42 AM  Result Value Ref Range   WBC 5.7 4.0 - 10.5 K/uL   RBC 3.36 (L) 3.87 - 5.11 MIL/uL   Hemoglobin 7.6 (L) 12.0 - 15.0 g/dL    Comment: Reticulocyte Hemoglobin testing may be clinically indicated, consider ordering this additional test [id number]    HCT 24.9 (L) 36.0 - 46.0 %   MCV 74.1 (L) 80.0 - 100.0 fL   MCH 22.6 (L) 26.0 - 34.0 pg   MCHC 30.5 30.0 - 36.0 g/dL   RDW 36.1 (H) 44.3 - 15.4 %   Platelets 188 150 - 400 K/uL    Comment: REPEATED TO VERIFY   nRBC 0.0 0.0 - 0.2 %   Neutrophils Relative % 56 %   Neutro Abs 3.2 1.7 - 7.7 K/uL   Lymphocytes Relative 27 %   Lymphs Abs 1.5 0.7 - 4.0 K/uL   Monocytes Relative 14 %   Monocytes Absolute 0.8 0.1 - 1.0 K/uL   Eosinophils Relative 2 %   Eosinophils Absolute 0.1 0.0 - 0.5 K/uL   Basophils Relative 1 %   Basophils Absolute 0.0 0.0 - 0.1 K/uL   Immature Granulocytes 0 %   Abs Immature Granulocytes  0.02 0.00 - 0.07 K/uL   Tear Drop Cells PRESENT    Polychromasia PRESENT    Ovalocytes PRESENT     Comment: Performed at [facility], [address], [place], [place] [address]  Brain natriuretic peptide     Status: None   Collection Time: 09/11/22  3:42 AM  Result Value Ref Range   B Natriuretic Peptide 73.1 0.0 - 100.0 pg/mL    Comment: Performed at [facility], [address] [place], [place] [address]  Basic metabolic panel     Status: Abnormal   Collection Time: 09/11/22  3:42 AM  Result Value Ref Range   Sodium 140 135 - 145 mmol/L   Potassium 3.9 3.5 - 5.1 mmol/L   Chloride 108 98 - 111 mmol/L   CO2 25 22 - 32 mmol/L   Glucose, Bld 100 (H) 70 - 99 mg/dL    Comment: Glucose reference range applies only to samples taken after fasting for at least 8 hours.   BUN 11 6 - 20 mg/dL   Creatinine, Ser 9.32 0.44 - 1.00 mg/dL   Calcium 8.9 8.9 - 67.1 mg/dL   GFR, Estimated >24 >58 mL/min    Comment: (NOTE) Calculated using the CKD-EPI Creatinine Equation (2021)    Anion gap 7 5 - 15    Comment: Performed at [facility], [address] [place],  [place] [address]   No results found.    Assessment/Plan Moderate to large external hemorrhoids with possible prolapsed hemorrhoids as well The patient has been seen, examined, chart, labs, and vitals reviewed.  The patient has intermittently bleeding hemorrhoids.  She has been transfused since admission.  Her is currently on miralax BID and should continue this to keep her stools soft to help minimize bleeding with her BMs.  Agree with Anusol cream, TID, and proctofoam BID.  Will add lidocaine jelly for pain control as well as sitz bathes q 4 hours.  We discussed continuing to do these things even after discharge.  We also discussed that due to the size of her hemorrhoids, these are not going to get better within just a week.  She needs to follow up with one of our colorectal specialists so she can continue to get further  care to try to get these to resolve without surgery, but then they can decided if she fails conservative management when is the appropriate time to offer surgical intervention.  This is not a benign, risk-free surgery so making it the next step is not the best solution to the problem.  She expressed understanding.  Patient is surgically stable for DC home when medically stable.  Have placed our office information in her AVS and other information as well for her to follow up as an outpatient.    I reviewed Consultant GI notes, hospitalist notes, last 24 h vitals and pain scores, last 48 h intake and output, last 24 h labs and trends, and last 24 h imaging results.  Letha Cape, [facility] 09/11/2022, 9:28 AM Please see Amion for pager number during day hours 7:00am-4:30pm or 7:00am -11:30am on weekends

## 2022-09-12 ENCOUNTER — Other Ambulatory Visit (HOSPITAL_COMMUNITY): Payer: Self-pay

## 2022-09-12 LAB — CBC WITH DIFFERENTIAL/PLATELET
Abs Immature Granulocytes: 0.02 10*3/uL (ref 0.00–0.07)
Basophils Absolute: 0 10*3/uL (ref 0.0–0.1)
Basophils Relative: 0 %
Eosinophils Absolute: 0.1 10*3/uL (ref 0.0–0.5)
Eosinophils Relative: 2 %
HCT: 26.3 % — ABNORMAL LOW (ref 36.0–46.0)
Hemoglobin: 7.8 g/dL — ABNORMAL LOW (ref 12.0–15.0)
Immature Granulocytes: 0 %
Lymphocytes Relative: 24 %
Lymphs Abs: 1.3 10*3/uL (ref 0.7–4.0)
MCH: 22.5 pg — ABNORMAL LOW (ref 26.0–34.0)
MCHC: 29.7 g/dL — ABNORMAL LOW (ref 30.0–36.0)
MCV: 75.8 fL — ABNORMAL LOW (ref 80.0–100.0)
Monocytes Absolute: 1 10*3/uL (ref 0.1–1.0)
Monocytes Relative: 18 %
Neutro Abs: 2.9 10*3/uL (ref 1.7–7.7)
Neutrophils Relative %: 56 %
Platelets: 197 10*3/uL (ref 150–400)
RBC: 3.47 MIL/uL — ABNORMAL LOW (ref 3.87–5.11)
RDW: 23.9 % — ABNORMAL HIGH (ref 11.5–15.5)
WBC: 5.3 10*3/uL (ref 4.0–10.5)
nRBC: 0 % (ref 0.0–0.2)

## 2022-09-12 LAB — BASIC METABOLIC PANEL
Anion gap: 6 (ref 5–15)
BUN: 8 mg/dL (ref 6–20)
CO2: 24 mmol/L (ref 22–32)
Calcium: 8.8 mg/dL — ABNORMAL LOW (ref 8.9–10.3)
Chloride: 111 mmol/L (ref 98–111)
Creatinine, Ser: 0.94 mg/dL (ref 0.44–1.00)
GFR, Estimated: >60 mL/min (ref >60)
Glucose, Bld: 100 mg/dL — ABNORMAL HIGH (ref 70–99)
Potassium: 3.5 mmol/L (ref 3.5–5.1)
Sodium: 141 mmol/L (ref 135–145)

## 2022-09-12 LAB — MAGNESIUM: Magnesium: 2.2 mg/dL (ref 1.7–2.4)

## 2022-09-12 LAB — BRAIN NATRIURETIC PEPTIDE: B Natriuretic Peptide: 54.2 pg/mL (ref 0.0–100.0)

## 2022-09-12 MED ORDER — SENNOSIDES-DOCUSATE SODIUM 8.6-50 MG PO TABS
1.0000 | ORAL_TABLET | Freq: Every day | ORAL | 0 refills | Status: DC
  Filled 2022-09-12: qty 60, 60d supply, fill #0

## 2022-09-12 MED ORDER — POLYETHYLENE GLYCOL 3350 17 GM/SCOOP PO POWD
17.0000 g | Freq: Every day | ORAL | 0 refills | Status: DC
  Filled 2022-09-12: qty 238, 14d supply, fill #0

## 2022-09-12 MED ORDER — FOLIC ACID 1 MG PO TABS
1.0000 mg | ORAL_TABLET | Freq: Every day | ORAL | 0 refills | Status: DC
  Filled 2022-09-12: qty 30, 30d supply, fill #0

## 2022-09-12 MED ORDER — DOCUSATE SODIUM 100 MG PO CAPS
200.0000 mg | ORAL_CAPSULE | Freq: Every day | ORAL | 0 refills | Status: DC
  Filled 2022-09-12: qty 30, 15d supply, fill #0

## 2022-09-12 MED ORDER — FERROUS SULFATE 325 (65 FE) MG PO TABS
325.0000 mg | ORAL_TABLET | Freq: Every day | ORAL | 0 refills | Status: DC
  Filled 2022-09-12: qty 30, 30d supply, fill #0

## 2022-09-12 NOTE — Discharge Summary (Signed)
Julie Dyer HKV:[id number] DOB: 1961-04-03 DOA: 09/08/2022  PCP: Georganna Skeans, MD  Admit date: 09/08/2022  Discharge date: 09/12/2022  Admitted From: Home   Disposition:  Home   Recommendations for Outpatient Follow-up:   Follow up with PCP in 1-2 weeks  PCP Please obtain BMP/CBC, 2 view CXR in 1week,  (see Discharge instructions)   PCP Please follow up on the following pending results:    Home Health: None   Equipment/Devices: None  Consultations: GI< CCS Discharge Condition: Stable    CODE STATUS: Full    Diet Recommendation: Heart Healthy     Chief Complaint  Patient presents with   Rectal Bleeding     Brief history of present illness from the day of admission and additional interim summary     61 y.o. female with medical history significant of glaucoma presenting with rectal bleeding. She was previously hospitalized in 7/22 with bleeding hemorrhoids; colonoscopy showed large prolapsed hemorrhoids and she was transfused and discharged.  She has severe glaucoma and cannot work so she has no insurance.  As a result, she never followed up with surgery as recommended.  She started with severe bleeding again last Wednesday and became SOB and fatigued with exertion Friday, in the ER she was found to have profound anemia due to lower GI blood loss and was admitted to the hospital.                                                                  Hospital Course   Symptomatic acute lower GI bleed related anemia in a patient with known history of bleeding hemorrhoids. She has known history of bleeding hemorrhoids however could not follow-up outpatient due to lack of insurance, currently she is s/p 3 units of packed RBC transfusion, posttransfusion H&H stable, placed on stool softeners, Anusol, sitz bath, placed on  oral iron and folic acid, he was seen by GI and CCS, underwent EGD to rule out upper GI bleed, EGD was stable.  Per general surgery stable for discharge instructions provided, avoid constipation close outpatient follow-up with PCP and general surgery, PCP to monitor CBC and anemia panel closely.   Glaucoma.  Continue home regimen of brimonidine, Cosopt, and Vyzulta .   Hypertension.  On Cozaar.   Hypokalemia.  Replaced.     Discharge diagnosis     Principal Problem:   Acute lower GI bleeding Active Problems:   Hemorrhoids   ABLA (acute blood loss anemia)   Glaucoma (increased eye pressure)   Essential hypertension   DNR (do not resuscitate)    Discharge instructions    Discharge Instructions     Amb Referral to Nutrition and Diabetic Education   Complete by: As directed    Diet - low sodium heart healthy   Complete by: As directed  Discharge instructions   Complete by: As directed    Continue taking sitz bath's twice a day as instructed before.    Follow with Primary MD Georganna Skeans, MD in 7 days   Get CBC, CMP, anemia panel-  checked next visit with your primary MD    Activity: As tolerated with Full fall precautions use walker/cane & assistance as needed  Disposition Home   Diet: Heart Healthy   Special Instructions: If you have smoked or chewed Tobacco  in the last 2 yrs please stop smoking, stop any regular Alcohol  and or any Recreational drug use.  On your next visit with your primary care physician please Get Medicines reviewed and adjusted.  Please request your Prim.MD to go over all Hospital Tests and Procedure/Radiological results at the follow up, please get all Hospital records sent to your Prim MD by signing hospital release before you go home.  If you experience worsening of your admission symptoms, develop shortness of breath, life threatening emergency, suicidal or homicidal thoughts you must seek medical attention immediately by calling 911 or  calling your MD immediately  if symptoms less severe.  You Must read complete instructions/literature along with all the possible adverse reactions/side effects for all the Medicines you take and that have been prescribed to you. Take any new Medicines after you have completely understood and accpet all the possible adverse reactions/side effects.   Increase activity slowly   Complete by: As directed        Discharge Medications   Allergies as of 09/12/2022   No Known Allergies      Medication List     TAKE these medications    brimonidine 0.2 % ophthalmic solution Commonly known as: ALPHAGAN Place 1 drop into the left eye 2 (two) times daily.   dibucaine 1 % Oint Commonly known as: NUPERCAINAL Place 1 application rectally as needed for hemorrhoids (As needed for pain.  (no more than 30 grams of 1% ointment per day)).   docusate sodium 100 MG capsule Commonly known as: COLACE Take 2 capsules (200 mg total) by mouth daily.   dorzolamide-timolol 2-0.5 % ophthalmic solution Commonly known as: COSOPT Place 1 drop into the left eye 2 (two) times daily.   ferrous sulfate 325 (65 FE) MG tablet Take 1 tablet (325 mg total) by mouth daily with breakfast.   folic acid 1 MG tablet Commonly known as: FOLVITE Take 1 tablet (1 mg total) by mouth daily.   hydrocortisone 2.5 % rectal cream Commonly known as: Anusol-HC Place 1 application rectally 2 (two) times daily.   losartan 100 MG tablet Commonly known as: COZAAR Take 1 tablet (100 mg total) by mouth daily.   polyethylene glycol 17 g packet Commonly known as: MIRALAX / GLYCOLAX Take 17 g by mouth daily.   senna-docusate 8.6-50 MG tablet Commonly known as: Senna Plus Take 1 tablet by mouth daily.   Vyzulta 0.024 % Soln Generic drug: Latanoprostene Bunod Place 1 drop into the left eye every evening.         Follow-up Information     Georganna Skeans, MD. Schedule an appointment as soon as possible for a visit in 1  week(s).   Specialty: Family Medicine Contact information: [address] [place] [address] [phone]         Surgery, [place] Follow up in 2 week(s).   Specialty: General Surgery Why: Please call our office to schedule and appointment with a colorectal surgeon regarding care for  your hemorrhoids. Contact information: [address] [place] [address] [phone]         Georganna Skeans, MD .   Specialty: Family Medicine Contact information: [address] [place] [address] [phone]                 Major procedures and Radiology Reports - PLEASE review detailed and final reports thoroughly  -      EGD -    Impression:               1. Normal EGD. 2. 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   Recommendation:              1. Patient has a contact number available for emergencies. The signs and symptoms of potential delayed complications were discussed with the patient. Return to normal activities tomorrow. Written discharge instructions were provided to the patient. 2. Resume previous diet. 3. Continue present medications. 4. Please consult general surgery for hemorrhoidectomy. This is the patient's second admission for profound anemia secondary to large bleeding hemorrhoids amenable to medical for ligation therapy.  Discussed with the patient. Provided her a copy of this report. We will sign off  Today   Subjective    Julie Dyer today has no headache,no chest abdominal pain,no new weakness tingling or numbness, feels much better wants to go home today.    Objective   Blood pressure 130/80, pulse 76, temperature 98.6 F (37 C), temperature source Oral, resp. rate 16, height 5' 5.5" (1.664 m), weight 67.1 kg, SpO2 100 %.   Intake/Output Summary (Last 24 hours) at 09/12/2022 0932 Last data filed at 09/11/2022 1700 Gross per 24 hour  Intake 240 ml  Output  --  Net 240 ml    Exam  Awake Alert, No new F.N deficits,    [place].AT,PERRAL Supple Neck,   Symmetrical Chest wall movement, Good air movement bilaterally, CTAB RRR,No Gallops,   +ve B.Sounds, Abd Soft, Non tender,  No Cyanosis, Clubbing or edema    Data Review   Recent Labs  Lab 09/08/22 1433 09/09/22 0618 09/10/22 0312 09/11/22 0342 09/12/22 0526  WBC 8.1 8.0 7.5 5.7 5.3  HGB 4.1* 7.8* 8.6* 7.6* 7.8*  HCT 15.7* 25.2* 28.4* 24.9* 26.3*  PLT PLATELET CLUMPS NOTED ON SMEAR, COUNT APPEARS ADEQUATE 185 203 188 197  MCV 67.4* 73.0* 73.2* 74.1* 75.8*  MCH 17.6* 22.6* 22.2* 22.6* 22.5*  MCHC 26.1* 31.0 30.3 30.5 29.7*  RDW 19.3* 21.7* 22.4* 23.2* 23.9*  LYMPHSABS 1.2  --  2.0 1.5 1.3  MONOABS 0.6  --  0.9 0.8 1.0  EOSABS 0.0  --  0.1 0.1 0.1  BASOSABS 0.0  --  0.0 0.0 0.0    Recent Labs  Lab 09/08/22 1433 09/09/22 0618 09/10/22 0312 09/11/22 0342 09/12/22 0526  NA 137 141 142 140 141  K 3.9 3.4* 3.7 3.9 3.5  CL 106 112* 112* 108 111  CO2 20* 20* 21* 25 24  GLUCOSE 110* 93 104* 100* 100*  BUN 13 6 <5* 11 8  CREATININE 0.96 0.75 0.80 0.84 0.94  CALCIUM 9.0 8.6* 9.1 8.9 8.8*  AST 17  --   --   --   --   ALT 8  --   --   --   --   ALKPHOS 35*  --   --   --   --   BILITOT 1.1  --   --   --   --  ALBUMIN 3.6  --   --   --   --   MG  --  2.1 2.1 2.2 2.2  INR  --  1.1  --   --   --   BNP  --   --  161.4* 73.1 54.2    Total Time in preparing paper work, data evaluation and todays exam - 35 minutes  Susa Raring M.D on 09/12/2022 at 9:32 AM  [facility]

## 2022-09-12 NOTE — Discharge Instructions (Addendum)
Continue twice a day sitz bath as instructed before.    Follow with Primary MD Georganna Skeans, MD in 7 days   Get CBC, CMP, anemia panel-  checked next visit with your primary MD    Activity: As tolerated with Full fall precautions use walker/cane & assistance as needed  Disposition Home   Diet: Heart Healthy   Special Instructions: If you have smoked or chewed Tobacco  in the last 2 yrs please stop smoking, stop any regular Alcohol  and or any Recreational drug use.  On your next visit with your primary care physician please Get Medicines reviewed and adjusted.  Please request your Prim.MD to go over all Hospital Tests and Procedure/Radiological results at the follow up, please get all Hospital records sent to your Prim MD by signing hospital release before you go home.  If you experience worsening of your admission symptoms, develop shortness of breath, life threatening emergency, suicidal or homicidal thoughts you must seek medical attention immediately by calling 911 or calling your MD immediately  if symptoms less severe.  You Must read complete instructions/literature along with all the possible adverse reactions/side effects for all the Medicines you take and that have been prescribed to you. Take any new Medicines after you have completely understood and accpet all the possible adverse reactions/side effects.

## 2022-09-13 ENCOUNTER — Encounter (HOSPITAL_COMMUNITY): Payer: Self-pay | Admitting: Internal Medicine

## 2022-09-17 ENCOUNTER — Telehealth: Payer: Self-pay

## 2022-09-17 NOTE — Telephone Encounter (Addendum)
Transition Care Management Unsuccessful Follow-up Telephone Call  Date of discharge and from where:  09/12/2022, [facility]  Attempts:  1st Attempt  Reason for unsuccessful TCM follow-up call:  Left voice message [phone], call back requested    An initial TOC call was made 09/16/2022, a message was also left with call back requested.   Need to discuss scheduling a hospital follow up appointment with PCP

## 2022-09-18 ENCOUNTER — Telehealth: Payer: Self-pay

## 2022-09-18 NOTE — Telephone Encounter (Signed)
Transition Care Management Follow-up Telephone Call Date of discharge and from where: 09/12/2022, [facility] How have you been since you were released from the hospital? She said she is still recovering, getting better every day.  Any questions or concerns? Julie Dyer is concerned about her lack of insurance coverage because she needs to see a specialist. She said she just started to explore insurance options through the Market place this morning. She does not have CAFA/OC  Items Reviewed: Did the pt receive and understand the discharge instructions provided? Yes  Medications obtained and verified? Yes - she said she has all of her medications and she did not have any questions about the med regime  Other? No  Any new allergies since your discharge? No  Dietary orders reviewed? No Do you have support at home?  Lives alone but has support  Home Care and Equipment/Supplies: Were home health services ordered? no If so, what is the name of the agency? N/a  Has the agency set up a time to come to the patient's home? not applicable Were any new equipment or medical supplies ordered?  No What is the name of the medical supply agency? N/a Were you able to get the supplies/equipment? not applicable Do you have any questions related to the use of the equipment or supplies? No  Functional Questionnaire: (I = Independent and D = Dependent) ADLs: independent  Follow up appointments reviewed:  PCP Hospital f/u appt confirmed? Yes  Scheduled to see Dr Andrey Campanile- 10/17/2022.  She did not want to be seen any sooner  Specialist Hospital f/u appt confirmed?  None scheduled at this time    Are transportation arrangements needed?  She may need transportation and said she wants to try to arrange a ride herself. I told her to call the clinic if she is unable to arrange transportation and we can order a cab for her.  If their condition worsens, is the pt aware to call PCP or go to the Emergency Dept.?  Yes Was the patient provided with contact information for the PCP's office or ED? Yes Was to pt encouraged to call back with questions or concerns? Yes

## 2022-09-24 ENCOUNTER — Ambulatory Visit (HOSPITAL_COMMUNITY)
Admission: EM | Admit: 2022-09-24 | Discharge: 2022-09-24 | Disposition: A | Discharge status: Home / Self Care | Payer: Self-pay

## 2022-09-24 ENCOUNTER — Encounter (HOSPITAL_COMMUNITY): Payer: Self-pay | Admitting: *Deleted

## 2022-09-24 DIAGNOSIS — R600 Localized edema: Secondary | ICD-10-CM

## 2022-09-24 DIAGNOSIS — I10 Essential (primary) hypertension: Secondary | ICD-10-CM

## 2022-09-24 LAB — COMPREHENSIVE METABOLIC PANEL
ALT: 9 U/L (ref 0–44)
AST: 16 U/L (ref 15–41)
Albumin: 3.9 g/dL (ref 3.5–5.0)
Alkaline Phosphatase: 49 U/L (ref 38–126)
Anion gap: 11 (ref 5–15)
BUN: <5 mg/dL — ABNORMAL LOW (ref 6–20)
CO2: 23 mmol/L (ref 22–32)
Calcium: 9.5 mg/dL (ref 8.9–10.3)
Chloride: 108 mmol/L (ref 98–111)
Creatinine, Ser: 0.83 mg/dL (ref 0.44–1.00)
GFR, Estimated: >60 mL/min (ref >60)
Glucose, Bld: 79 mg/dL (ref 70–99)
Potassium: 4 mmol/L (ref 3.5–5.1)
Sodium: 142 mmol/L (ref 135–145)
Total Bilirubin: 0.9 mg/dL (ref 0.3–1.2)
Total Protein: 7.2 g/dL (ref 6.5–8.1)

## 2022-09-24 LAB — D-DIMER, QUANTITATIVE: D-Dimer, Quant: 2.79 ug/mL-FEU — ABNORMAL HIGH (ref 0.00–0.50)

## 2022-09-24 LAB — BRAIN NATRIURETIC PEPTIDE: B Natriuretic Peptide: 210.2 pg/mL — ABNORMAL HIGH (ref 0.0–100.0)

## 2022-09-24 MED ORDER — CLONIDINE HCL 0.1 MG PO TABS
ORAL_TABLET | ORAL | Status: AC
  Filled 2022-09-24: qty 2

## 2022-09-24 MED ORDER — CLONIDINE HCL 0.1 MG PO TABS
0.2000 mg | ORAL_TABLET | Freq: Once | ORAL | Status: AC
  Administered 2022-09-24: 0.2 mg via ORAL

## 2022-09-24 MED ORDER — HYDROCHLOROTHIAZIDE 25 MG PO TABS: 25.0000 mg | ORAL_TABLET | Freq: Every day | ORAL | 0 refills | Status: DC

## 2022-09-24 NOTE — ED Provider Notes (Signed)
[facility]    CSN: [id number] Arrival date & time: 09/24/22  1649      History   Chief Complaint Chief Complaint  Patient presents with   Leg Swelling   Medication Reaction    HPI Julie Dyer is a 61 y.o. female.   Subjective:  Julie Dyer is a 61 y.o. female that presents with complaints of bilateral edema in both her ankles, feet and lower legs. The edema has been progressively worsening. Onset of symptoms was several days ago. The edema is present all day. The patient states the problem is new. The patient was recently admitted to the hospital on 09/08/2022 for acute GI blood loss anemia.  She received blood transfusions and was stabilized. She was discharged on 09/12/2022. Patient reports that she was prescribed losartan at discharge. Patient denies history of hypertension; however, in review of patient's records, she was actually diagnosed with hypertension by her PCP on 07/30/2021 and prescribed losartan 50 mg daily.  Patient reports that she remembers being prescribed this medication but never took it. Patient states that she told the staff at the hospital that she was not on any medication for blood pressure but hospital records shows that losartan was given to her daily while she was in the hospital. Upon being discharged, patient has been taking the losartan. She has also been accidentally taking her husband's hydrochlorothiazide daily.  She started to note leg swelling several days after being discharged from the hospital and thought that the medications was the cause so she stopped both the hydrochlorothiazide and the losartan abruptly a couple of days ago. The swelling has continued to worsen. She also reports tightness in her legs, ankles and feet. She denies any chest pain, shortness of breath, palpitations, dizziness, weakness, melena, rectal bleeding, abdominal pain, nausea or vomiting. She denies any history of heart failure.          Past  Medical History:  Diagnosis Date   Glaucoma    Hemorrhoids     Patient Active Problem List   Diagnosis Date Noted   Acute lower GI bleeding 09/08/2022   ABLA (acute blood loss anemia) 09/08/2022   Glaucoma (increased eye pressure) 09/08/2022   Essential hypertension 09/08/2022   DNR (do not resuscitate) 09/08/2022   Hemorrhoids    Constipation    Anemia 05/29/2021    Past Surgical History:  Procedure Laterality Date   COLONOSCOPY WITH PROPOFOL N/A 06/01/2021   Procedure: COLONOSCOPY WITH PROPOFOL;  Surgeon: Lynann Bologna, MD;  Location: [facility] ENDOSCOPY;  Service: Endoscopy;  Laterality: N/A;   ESOPHAGOGASTRODUODENOSCOPY (EGD) WITH PROPOFOL N/A 09/10/2022   Procedure: ESOPHAGOGASTRODUODENOSCOPY (EGD) WITH PROPOFOL;  Surgeon: Hilarie Fredrickson, MD;  Location: [facility] ENDOSCOPY;  Service: Gastroenterology;  Laterality: N/A;   GLAUCOMA SURGERY Left     OB History   No obstetric history on file.      Home Medications    Prior to Admission medications   Medication Sig Start Date End Date Taking? Authorizing Provider  brimonidine (ALPHAGAN) 0.2 % ophthalmic solution Place 1 drop into the left eye 2 (two) times daily. 03/05/21  Yes [provider]  dibucaine (NUPERCAINAL) 1 % OINT Place 1 application rectally as needed for hemorrhoids (As needed for pain.  (no more than 30 grams of 1% ointment per day)). 06/01/21  Yes Rehman, Areeg N, DO  docusate sodium (COLACE) 100 MG capsule Take 2 capsules (200 mg total) by mouth daily. 09/12/22  Yes Leroy Sea, MD  dorzolamide-timolol (COSOPT)  22.3-6.8 MG/ML ophthalmic solution Place 1 drop into the left eye 2 (two) times daily. 05/12/20  Yes [provider]  ferrous sulfate 325 (65 FE) MG tablet Take 1 tablet (325 mg total) by mouth daily with breakfast. 09/12/22  Yes Leroy Sea, MD  folic acid (FOLVITE) 1 MG tablet Take 1 tablet (1 mg total) by mouth daily. 09/12/22  Yes Leroy Sea, MD  hydrochlorothiazide (HYDRODIURIL) 25  MG tablet Take 1 tablet (25 mg total) by mouth daily. 09/24/22  Yes Lurline Idol, FNP  hydrocortisone (ANUSOL-HC) 2.5 % rectal cream Place 1 application rectally 2 (two) times daily. 07/30/21  Yes Georganna Skeans, MD  polyethylene glycol powder (GLYCOLAX/MIRALAX) 17 GM/SCOOP powder Take 17 g by mouth daily. 09/12/22  Yes Leroy Sea, MD  senna-docusate (SENNA PLUS) 8.6-50 MG tablet Take 1 tablet by mouth daily. 09/12/22  Yes Leroy Sea, MD  VYZULTA 0.024 % SOLN Place 1 drop into the left eye every evening. 04/27/20  Yes [provider]    Family History Family History  Problem Relation Age of Onset   Diabetes Mother    Alcohol abuse Mother    Alcohol abuse Father     Social History Social History   Tobacco Use   Smoking status: Never   Smokeless tobacco: Never  Vaping Use   Vaping Use: Never used  Substance Use Topics   Alcohol use: Not Currently   Drug use: Not Currently     Allergies   Patient has no known allergies.   Review of Systems Review of Systems  Constitutional:  Negative for unexpected weight change.  Respiratory:  Negative for shortness of breath.   Cardiovascular:  Positive for leg swelling. Negative for chest pain and palpitations.  Gastrointestinal:  Negative for blood in stool, nausea, rectal pain and vomiting.  Neurological:  Negative for dizziness, weakness and headaches.  All other systems reviewed and are negative.    Physical Exam Triage Vital Signs ED Triage Vitals  Enc Vitals Group     BP 09/24/22 1715 (!) 192/94     Pulse Rate 09/24/22 1715 (!) 59     Resp 09/24/22 1715 18     Temp 09/24/22 1715 98.2 F (36.8 C)     Temp Source 09/24/22 1715 Oral     SpO2 09/24/22 1715 99 %     Weight --      Height --      Head Circumference --      Peak Flow --      Pain Score 09/24/22 1711 7     Pain Loc --      Pain Edu? --      Excl. in GC? --    No data found.  Updated Vital Signs BP 136/82 (BP Location: Left Arm)    Pulse (!) 59   Temp 98.2 F (36.8 C) (Oral)   Resp 18   SpO2 99%   Visual Acuity Right Eye Distance:   Left Eye Distance:   Bilateral Distance:    Right Eye Near:   Left Eye Near:    Bilateral Near:     Physical Exam Vitals reviewed.  Constitutional:      General: She is not in acute distress.    Appearance: Normal appearance. She is not ill-appearing, toxic-appearing or diaphoretic.  HENT:     Head: Normocephalic.  Cardiovascular:     Rate and Rhythm: Normal rate and regular rhythm.     Pulses: Normal pulses.  Heart sounds: Normal heart sounds.  Pulmonary:     Effort: Pulmonary effort is normal.     Breath sounds: Normal breath sounds.  Abdominal:     Palpations: Abdomen is soft.  Musculoskeletal:        General: Normal range of motion.     Cervical back: Normal range of motion and neck supple.     Right lower leg: No tenderness. 2+ Pitting Edema present.     Left lower leg: No tenderness. 2+ Pitting Edema present.     Right ankle: Swelling present. No tenderness.     Right Achilles Tendon: Normal.     Left ankle: Swelling present. No tenderness.     Left Achilles Tendon: Normal.     Right foot: Swelling present. No tenderness.     Left foot: Swelling present. No tenderness.  Skin:    General: Skin is warm and dry.  Neurological:     General: No focal deficit present.     Mental Status: She is alert and oriented to person, place, and time.      UC Treatments / Results  Labs (all labs ordered are listed, but only abnormal results are displayed) Labs Reviewed  BRAIN NATRIURETIC PEPTIDE  D-DIMER, QUANTITATIVE  COMPREHENSIVE METABOLIC PANEL    EKG   Radiology No results found.  Procedures Procedures (including critical care time)  Medications Ordered in UC Medications  cloNIDine (CATAPRES) tablet 0.2 mg (0.2 mg Oral Given 09/24/22 1815)    Initial Impression / Assessment and Plan / UC Course  I have reviewed the triage vital signs and the  nursing notes.  Pertinent labs & imaging results that were available during my care of the patient were reviewed by me and considered in my medical decision making (see chart for details).    61 year old female who was recently hospitalized for acute GI blood loss anemia requiring blood transfusions presents to the urgent care with a 5-day history of worsening bilateral lower extremity edema. She has a history of hypertension which was diagnosed in September 2022 but she has been noncompliant with the prescribed losartan therapy up until recently. Patient was given Losartan while she was hospitalized between 09/08/2022 through 09/12/2022 and she has continued to take this since being discharged. She's also been taking her husband's hydrochlorothiazide as well. Patient reports that she didn't realize that the HCTZ belonged to her husband until recently. Patient reports that the swelling has gotten worse and she abruptly stopped both of these therapies a couple of days ago. No chest pain, shortness of breath, palpitations, dizziness or weakness. In the clinic, patient was noted to be hypertensive at 192/94. All other vital signs are unremarkable. She is nontoxic appearing and in no acute distress. On exam, she has bilateral lower extremity pitting edema. The remainder of the exam was unremarkable. Patient was given Clonidine 0.2 mg in the clinic. Repeat BP 136/82.  CMP, D-Dimer and BNP pending. Strict ED precautions discussed with patient and her husband. Patient has post-hospital follow-up scheduled with her PCP on December 14th. Recommended that she call in the morning and try to get the appointment moved up to a sooner date.   Today's evaluation has revealed no signs of a dangerous process. Discussed diagnosis with patient and/or guardian. Patient and/or guardian aware of their diagnosis, possible red flag symptoms to watch out for and need for close follow up. Patient and/or guardian understands verbal and  written discharge instructions. Patient and/or guardian comfortable with plan and disposition.  Patient  and/or guardian has a clear mental status at this time, good insight into illness (after discussion and teaching) and has clear judgment to make decisions regarding their care  Documentation was completed with the aid of voice recognition software. Transcription may contain typographical errors. Final Clinical Impressions(s) / UC Diagnoses   Final diagnoses:  Bilateral lower extremity edema  Essential hypertension     Discharge Instructions      Peripheral edema is swelling that is caused by a buildup of fluid. Peripheral edema most often affects the lower legs, ankles, and feet.   There are many causes of leg swelling. Blood studies have been done today which will be helpful in determining the cause of your swelling. It's important to follow-up with your primary care doctor as there may be additional testing needed once your blood work has resulted.   Stop taking the cozar (losartan)  Start taking the hydrocholorothiazide daily which will help with your blood pressure and leg swelling  Raise (elevate) your legs while you are sitting or lying down. Move around often to prevent stiffness and to reduce swelling. Exercise your legs to get your circulation going. This helps to move the fluid back into your blood vessels, and it may help the swelling go down. Wear compression stockings. These can be purchased at [employer] or any medical supply stores.  Eat a low-salt (low-sodium) diet which may reduce swelling. Pay attention to any changes in your symptoms.  Go to the ED immediately if:  You develop shortness of breath, especially when you are lying down. You have pain in your chest or abdomen. You feel weak. You feel like you will faint. You have a fever. You have swelling in only one leg. You have increased swelling, redness, or pain in one or both of your legs. You have drainage or  sores at the area where you have edema.    ED Prescriptions     Medication Sig Dispense Auth. Provider   hydrochlorothiazide (HYDRODIURIL) 25 MG tablet Take 1 tablet (25 mg total) by mouth daily. 30 tablet Lurline IdolMurrill, Natalio Salois, FNP      PDMP not reviewed this encounter.   Lurline IdolMurrill, Aalyssa Elderkin, [place] 09/24/22 1949

## 2022-09-24 NOTE — Discharge Instructions (Addendum)
Peripheral edema is swelling that is caused by a buildup of fluid. Peripheral edema most often affects the lower legs, ankles, and feet.   There are many causes of leg swelling. Blood studies have been done today which will be helpful in determining the cause of your swelling. It's important to follow-up with your primary care doctor as there may be additional testing needed once your blood work has resulted.   Stop taking the cozar (losartan)  Start taking the hydrocholorothiazide daily which will help with your blood pressure and leg swelling  Raise (elevate) your legs while you are sitting or lying down. Move around often to prevent stiffness and to reduce swelling. Exercise your legs to get your circulation going. This helps to move the fluid back into your blood vessels, and it may help the swelling go down. Wear compression stockings. These can be purchased at [place] or any medical supply stores.  Eat a low-salt (low-sodium) diet which may reduce swelling. Pay attention to any changes in your symptoms.  Go to the ED immediately if:  You develop shortness of breath, especially when you are lying down. You have pain in your chest or abdomen. You feel weak. You feel like you will faint. You have a fever. You have swelling in only one leg. You have increased swelling, redness, or pain in one or both of your legs. You have drainage or sores at the area where you have edema.

## 2022-09-24 NOTE — ED Triage Notes (Signed)
Pt states upon discharge she was given new meds and on day 5 she started to have bilateral leg swelling. She was started on HCTZ 25mg  QD she states she doesn't know why she was given it. She states that she does have hypertension but hasn't been on meds. She stopped all meds 3 days ago and the sx have got a little better.

## 2022-10-17 ENCOUNTER — Ambulatory Visit (INDEPENDENT_AMBULATORY_CARE_PROVIDER_SITE_OTHER): Payer: Self-pay | Admitting: Family Medicine

## 2022-10-17 ENCOUNTER — Encounter: Payer: Self-pay | Admitting: Family Medicine

## 2022-10-17 VITALS — BP 120/80 | HR 68 | Temp 98.1°F | Resp 16 | Wt 158.6 lb

## 2022-10-17 DIAGNOSIS — Z09 Encounter for follow-up examination after completed treatment for conditions other than malignant neoplasm: Secondary | ICD-10-CM

## 2022-10-17 DIAGNOSIS — D500 Iron deficiency anemia secondary to blood loss (chronic): Secondary | ICD-10-CM

## 2022-10-17 DIAGNOSIS — Z8719 Personal history of other diseases of the digestive system: Secondary | ICD-10-CM

## 2022-10-21 ENCOUNTER — Encounter: Payer: Self-pay | Admitting: Family Medicine

## 2022-10-21 NOTE — Progress Notes (Signed)
Established Patient Office Visit  Subjective    Patient ID: Von Inscoe, female    DOB: August 27, 1961  Age: 61 y.o. MRN: [id number]  CC:  Chief Complaint  Patient presents with   Follow-up    HFU    HPI Allye Hoyos presents for follow up of review of medical issues since recent hospital discharge with diagnosis of acute lower GI bleed.  Patient reports improvement of sx since discharge. Patient denies acute complaints or concerns.    Outpatient Encounter Medications as of 10/17/2022  Medication Sig   brimonidine (ALPHAGAN) 0.2 % ophthalmic solution Place 1 drop into the left eye 2 (two) times daily.   dibucaine (NUPERCAINAL) 1 % OINT Place 1 application rectally as needed for hemorrhoids (As needed for pain.  (no more than 30 grams of 1% ointment per day)).   docusate sodium (COLACE) 100 MG capsule Take 2 capsules (200 mg total) by mouth daily.   dorzolamide-timolol (COSOPT) 22.3-6.8 MG/ML ophthalmic solution Place 1 drop into the left eye 2 (two) times daily.   ferrous sulfate 325 (65 FE) MG tablet Take 1 tablet (325 mg total) by mouth daily with breakfast.   folic acid (FOLVITE) 1 MG tablet Take 1 tablet (1 mg total) by mouth daily.   hydrochlorothiazide (HYDRODIURIL) 25 MG tablet Take 1 tablet (25 mg total) by mouth daily.   hydrocortisone (ANUSOL-HC) 2.5 % rectal cream Place 1 application rectally 2 (two) times daily.   polyethylene glycol powder (GLYCOLAX/MIRALAX) 17 GM/SCOOP powder Take 17 g by mouth daily.   senna-docusate (SENNA PLUS) 8.6-50 MG tablet Take 1 tablet by mouth daily.   VYZULTA 0.024 % SOLN Place 1 drop into the left eye every evening.   No facility-administered encounter medications on file as of 10/17/2022.    Past Medical History:  Diagnosis Date   Glaucoma    Hemorrhoids     Past Surgical History:  Procedure Laterality Date   COLONOSCOPY WITH PROPOFOL N/A 06/01/2021   Procedure: COLONOSCOPY WITH PROPOFOL;  Surgeon: Lynann Bologna,  MD;  Location: [facility] ENDOSCOPY;  Service: Endoscopy;  Laterality: N/A;   ESOPHAGOGASTRODUODENOSCOPY (EGD) WITH PROPOFOL N/A 09/10/2022   Procedure: ESOPHAGOGASTRODUODENOSCOPY (EGD) WITH PROPOFOL;  Surgeon: Hilarie Fredrickson, MD;  Location: [facility] ENDOSCOPY;  Service: Gastroenterology;  Laterality: N/A;   GLAUCOMA SURGERY Left     Family History  Problem Relation Age of Onset   Diabetes Mother    Alcohol abuse Mother    Alcohol abuse Father     Social History   Socioeconomic History   Marital status: Single    Spouse name: Not on file   Number of children: Not on file   Years of education: Not on file   Highest education level: Not on file  Occupational History   Not on file  Tobacco Use   Smoking status: Never   Smokeless tobacco: Never  Vaping Use   Vaping Use: Never used  Substance and Sexual Activity   Alcohol use: Not Currently   Drug use: Not Currently   Sexual activity: Not on file  Other Topics Concern   Not on file  Social History Narrative   Not on file   Social Determinants of Health   Financial Resource Strain: High Risk (09/10/2022)   Overall Financial Resource Strain (CARDIA)    Difficulty of Paying Living Expenses: Hard  Food Insecurity: Not on file  Transportation Needs: No Transportation Needs (09/09/2022)   PRAPARE - Administrator, Civil Service (Medical): No  Lack of Transportation (Non-Medical): No  Physical Activity: Not on file  Stress: Not on file  Social Connections: Not on file  Intimate Partner Violence: Not on file    Review of Systems  All other systems reviewed and are negative.       Objective    BP 120/80   Pulse 68   Temp 98.1 F (36.7 C) (Oral)   Resp 16   Wt 158 lb 9.6 oz (71.9 kg)   SpO2 97%   BMI 25.99 kg/m   Physical Exam Vitals and nursing note reviewed.  Constitutional:      General: She is not in acute distress. Cardiovascular:     Rate and Rhythm: Normal rate and regular rhythm.  Pulmonary:      Effort: Pulmonary effort is normal.     Breath sounds: Normal breath sounds.  Abdominal:     Palpations: Abdomen is soft.     Tenderness: There is no abdominal tenderness.  Neurological:     General: No focal deficit present.     Mental Status: She is alert and oriented to person, place, and time.         Assessment & Plan:   1. Hospital discharge follow-up Improving without recurrent sx at this time  2. Iron deficiency anemia due to chronic blood loss Monitoring labs ordered as recommended. Med refilled - CBC with Differential - Basic Metabolic Panel  3. History of lower GI bleeding Patient awaiting scheduling with GI for further evaluation/mgt    No follow-ups on file.   Tommie Raymond, MD

## 2022-11-29 ENCOUNTER — Ambulatory Visit (HOSPITAL_COMMUNITY)
Admission: EM | Admit: 2022-11-29 | Discharge: 2022-11-29 | Disposition: A | Discharge status: Home / Self Care | Payer: Commercial Managed Care - HMO | Attending: Physician Assistant | Admitting: Physician Assistant

## 2022-11-29 ENCOUNTER — Encounter (HOSPITAL_COMMUNITY): Payer: Self-pay

## 2022-11-29 DIAGNOSIS — R030 Elevated blood-pressure reading, without diagnosis of hypertension: Secondary | ICD-10-CM

## 2022-11-29 DIAGNOSIS — H019 Unspecified inflammation of eyelid: Secondary | ICD-10-CM | POA: Diagnosis not present

## 2022-11-29 DIAGNOSIS — H409 Unspecified glaucoma: Secondary | ICD-10-CM

## 2022-11-29 DIAGNOSIS — H02846 Edema of left eye, unspecified eyelid: Secondary | ICD-10-CM

## 2022-11-29 DIAGNOSIS — I10 Essential (primary) hypertension: Secondary | ICD-10-CM

## 2022-11-29 MED ORDER — DOXYCYCLINE HYCLATE 100 MG PO CAPS: 100.0000 mg | ORAL_CAPSULE | Freq: Two times a day (BID) | ORAL | 0 refills | Status: DC

## 2022-11-29 MED ORDER — AMLODIPINE BESYLATE 10 MG PO TABS: 10.0000 mg | ORAL_TABLET | Freq: Every day | ORAL | 0 refills | Status: DC

## 2022-11-29 MED ORDER — TETRACAINE HCL 0.5 % OP SOLN
OPHTHALMIC | Status: AC
  Filled 2022-11-29: qty 4

## 2022-11-29 NOTE — ED Triage Notes (Signed)
Here for left eye swelling and pain x 6 days. Pt states she fell asleep watching her ipad and some how bump her eye.  Pt has been using old eye drops.

## 2022-11-29 NOTE — ED Provider Notes (Signed)
[place]    CSN: [id number] Arrival date & time: 11/29/22  1515      History   Chief Complaint Chief Complaint  Patient presents with   Eye Problem    HPI Julie Dyer is a 62 y.o. female.   Patient presents today with a 6-day history of swelling and pain of her left upper eyelid.  She denies any known injury but does report that she fell asleep and might have poked her eye on her iPad.  Since that time she has had increasing swelling and discomfort in the eye.  It is to the point that she is having difficulty opening the eye and this is her good eye as she has lost vision in the right eye due to glaucoma.  She does have a history of glaucoma in the left and has been taking her prescribed medications including brimonidine, latanoprost, dorzolamide and timolol.  She also tried some leftover steroid drops and wonders if this could have made it worse.  Reports that in 2020 she had a procedure to help manage her glaucoma at [facility].  She has been lost to follow-up as she does not currently have insurance.  She denies any visual disturbance, fever, nausea, vomiting, weakness.  Denies any recent antibiotic use.  Her blood pressure is elevated.  She believes that this is related to her acute illness.  She has not tried taking her antihypertensive medications.  Denies any headache, dizziness, chest pain, shortness of breath.  She has been using decongestants that she recently got over a cold and believes this could be contributing to her symptoms.  She does have a primary care but has not seen them recently.    Past Medical History:  Diagnosis Date   Glaucoma    Hemorrhoids     Patient Active Problem List   Diagnosis Date Noted   Acute lower GI bleeding 09/08/2022   ABLA (acute blood loss anemia) 09/08/2022   Glaucoma (increased eye pressure) 09/08/2022   Essential hypertension 09/08/2022   DNR (do not resuscitate) 09/08/2022   Hemorrhoids     Constipation    Anemia 05/29/2021    Past Surgical History:  Procedure Laterality Date   COLONOSCOPY WITH PROPOFOL N/A 06/01/2021   Procedure: COLONOSCOPY WITH PROPOFOL;  Surgeon: Jackquline Denmark, MD;  Location: [facility] ENDOSCOPY;  Service: Endoscopy;  Laterality: N/A;   ESOPHAGOGASTRODUODENOSCOPY (EGD) WITH PROPOFOL N/A 09/10/2022   Procedure: ESOPHAGOGASTRODUODENOSCOPY (EGD) WITH PROPOFOL;  Surgeon: Irene Shipper, MD;  Location: [place];  Service: Gastroenterology;  Laterality: N/A;   GLAUCOMA SURGERY Left     OB History   No obstetric history on file.      Home Medications    Prior to Admission medications   Medication Sig Start Date End Date Taking? Authorizing Provider  amLODipine (NORVASC) 10 MG tablet Take 1 tablet (10 mg total) by mouth daily. 11/29/22  Yes Shakinah Navis K, PA-C  doxycycline (VIBRAMYCIN) 100 MG capsule Take 1 capsule (100 mg total) by mouth 2 (two) times daily. 11/29/22  Yes Isidor Bromell K, PA-C  brimonidine (ALPHAGAN) 0.2 % ophthalmic solution Place 1 drop into the left eye 2 (two) times daily. 03/05/21   [provider]  dibucaine (NUPERCAINAL) 1 % OINT Place 1 application rectally as needed for hemorrhoids (As needed for pain.  (no more than 30 grams of 1% ointment per day)). 06/01/21   Rehman, Areeg N, DO  dorzolamide-timolol (COSOPT) 22.3-6.8 MG/ML ophthalmic solution Place 1 drop into the  left eye 2 (two) times daily. 05/12/20   [provider]  folic acid (FOLVITE) 1 MG tablet Take 1 tablet (1 mg total) by mouth daily. 09/12/22   Thurnell Lose, MD  polyethylene glycol powder (GLYCOLAX/MIRALAX) 17 GM/SCOOP powder Take 17 g by mouth daily. 09/12/22   Thurnell Lose, MD  senna-docusate (SENNA PLUS) 8.6-50 MG tablet Take 1 tablet by mouth daily. 09/12/22   Thurnell Lose, MD  VYZULTA 0.024 % SOLN Place 1 drop into the left eye every evening. 04/27/20   [provider]    Family History Family History  Problem Relation Age of Onset    Diabetes Mother    Alcohol abuse Mother    Alcohol abuse Father     Social History Social History   Tobacco Use   Smoking status: Never   Smokeless tobacco: Never  Vaping Use   Vaping Use: Never used  Substance Use Topics   Alcohol use: Not Currently   Drug use: Not Currently     Allergies   Patient has no known allergies.   Review of Systems Review of Systems  Constitutional:  Negative for activity change, appetite change, fatigue and fever.  Eyes:  Negative for photophobia, pain, discharge, redness, itching and visual disturbance.  Respiratory:  Negative for cough and shortness of breath.   Cardiovascular:  Negative for chest pain, palpitations and leg swelling.  Gastrointestinal:  Negative for abdominal pain, diarrhea, nausea and vomiting.  Neurological:  Negative for dizziness, light-headedness and headaches.     Physical Exam Triage Vital Signs ED Triage Vitals [11/29/22 1727]  Enc Vitals Group     BP (!) 194/110     Pulse Rate 65     Resp 20     Temp 98.6 F (37 C)     Temp Source Oral     SpO2 98 %     Weight      Height      Head Circumference      Peak Flow      Pain Score      Pain Loc      Pain Edu?      Excl. in [place]?    No data found.  Updated Vital Signs BP (!) 194/110 (BP Location: Left Arm) Comment: 184/90  Pulse 65   Temp 98.6 F (37 C) (Oral)   Resp 20   SpO2 98%   Visual Acuity Right Eye Distance:   Left Eye Distance:   Bilateral Distance:    Right Eye Near:   Left Eye Near:    Bilateral Near:     Physical Exam Vitals reviewed.  Constitutional:      General: She is awake. She is not in acute distress.    Appearance: Normal appearance. She is well-developed. She is not ill-appearing.     Comments: Very pleasant female appears stated age in no acute distress sitting comfortably in exam room  HENT:     Head: Normocephalic and atraumatic.  Eyes:     Intraocular pressure: Left eye pressure is 36 mmHg. Measurements were  taken using an automated tonometer.    Extraocular Movements: Extraocular movements intact.     Conjunctiva/sclera:     Right eye: Right conjunctiva is not injected. No chemosis.    Left eye: Left conjunctiva is not injected. No chemosis.    Pupils: Pupils are equal, round, and reactive to light.     Comments: Significant swelling of left upper eyelid.  Cardiovascular:  Rate and Rhythm: Normal rate and regular rhythm.     Heart sounds: Normal heart sounds, S1 normal and S2 normal. No murmur heard. Pulmonary:     Effort: Pulmonary effort is normal.     Breath sounds: Normal breath sounds. No wheezing, rhonchi or rales.     Comments: Clear to auscultation bilaterally Musculoskeletal:     Right lower leg: No edema.     Left lower leg: No edema.  Psychiatric:        Behavior: Behavior is cooperative.      UC Treatments / Results  Labs (all labs ordered are listed, but only abnormal results are displayed) Labs Reviewed - No data to display  EKG   Radiology No results found.  Procedures Procedures (including critical care time)  Medications Ordered in UC Medications - No data to display  Initial Impression / Assessment and Plan / UC Course  I have reviewed the triage vital signs and the nursing notes.  Pertinent labs & imaging results that were available during my care of the patient were reviewed by me and considered in my medical decision making (see chart for details).     Patient is well-appearing, afebrile, nontoxic, nontachycardic..  I contacted Dr. Darcel Bayley at [facility] discussed case.  He recommends she continue her glaucoma drops and start doxycycline to cover for potential eyelid infection.  She is to follow-up with their clinic next week and she was instructed to call them to schedule an appointment soon as possible.  Discussed that if she has any worsening or changing symptoms she needs to be reevaluated immediately.  Her blood pressure is very  elevated.  We discussed that this could be situational but given how elevated days I recommend that she consider restarting antihypertensive medication.  She is to avoid NSAIDs, decongestants, caffeine, sodium.  If her blood pressure remains elevated above 140/90 she is to start amlodipine and prescription was sent to pharmacy.  Recommend close follow-up with her primary care.  Discussed that if she felt any chest pain, shortness of breath, headache, vision change, dizziness in setting of high blood pressure she needs to go to the emergency room.  Final Clinical Impressions(s) / UC Diagnoses   Final diagnoses:  Infection of eyelid  Swelling of left eyelid  Glaucoma of left eye, unspecified glaucoma type  Elevated blood pressure reading     Discharge Instructions      Start doxycycline 100 mg twice daily for 7 days.  Follow-up with ophthalmology [facility]) as soon as possible.  Call them to schedule an appointment.  If anything worsens or changes you need to be seen immediately.  Your blood pressure is very elevated.  Please monitor this at home.  Stop decongestants and avoid caffeine, sodium, NSAIDs (aspirin, ibuprofen/Advil, naproxen/Aleve).  If your blood pressure remains elevated please start amlodipine as prescribed.  If you have any chest pain, shortness of breath, headache, vision change, dizziness in the setting of high blood pressure you need to go to the emergency room.     ED Prescriptions     Medication Sig Dispense Auth. Provider   doxycycline (VIBRAMYCIN) 100 MG capsule Take 1 capsule (100 mg total) by mouth 2 (two) times daily. 14 capsule Dereke Neumann K, PA-C   amLODipine (NORVASC) 10 MG tablet Take 1 tablet (10 mg total) by mouth daily. 30 tablet Shaneika Rossa, Noberto Retort, PA-C      PDMP not reviewed this encounter.   Jeani Hawking, PA-C 11/29/22 1826

## 2022-11-29 NOTE — Discharge Instructions (Signed)
Start doxycycline 100 mg twice daily for 7 days.  Follow-up with ophthalmology [facility]) as soon as possible.  Call them to schedule an appointment.  If anything worsens or changes you need to be seen immediately.  Your blood pressure is very elevated.  Please monitor this at home.  Stop decongestants and avoid caffeine, sodium, NSAIDs (aspirin, ibuprofen/Advil, naproxen/Aleve).  If your blood pressure remains elevated please start amlodipine as prescribed.  If you have any chest pain, shortness of breath, headache, vision change, dizziness in the setting of high blood pressure you need to go to the emergency room.

## 2022-12-31 DIAGNOSIS — H5789 Other specified disorders of eye and adnexa: Secondary | ICD-10-CM | POA: Insufficient documentation

## 2023-01-06 ENCOUNTER — Telehealth: Payer: Self-pay

## 2023-01-06 NOTE — Transitions of Care (Post Inpatient/ED Visit) (Signed)
   01/06/2023  Name: Julie Dyer MRN: [id number] DOB: [birth year]  Today's TOC FU Call Status: Today's TOC FU Call Status:: Successful TOC FU Call Competed TOC FU Call Complete Date: 01/06/23  Transition Care Management Follow-up Telephone Call Date of Discharge: 01/04/23 Discharge Facility: Other ([facility]) Name of Other (Non-[facility]) Discharge Facility: [facility] Type of Discharge: Inpatient Admission Primary Inpatient Discharge Diagnosis:: Eye swelling, left How have you been since you were released from the hospital?: Better Any questions or concerns?: No  Items Reviewed: Did you receive and understand the discharge instructions provided?: Yes Medications obtained and verified?: Yes (Medications Reviewed) Any new allergies since your discharge?: No Dietary orders reviewed?: Yes Do you have support at home?: Yes  Home Care and Equipment/Supplies: [place] Ordered?: No Any new equipment or medical supplies ordered?: No  Functional Questionnaire: Do you need assistance with bathing/showering or dressing?: No Do you need assistance with meal preparation?: No Do you need assistance with eating?: No Do you have difficulty maintaining continence: No Do you need assistance with getting out of bed/getting out of a chair/moving?: No Do you have difficulty managing or taking your medications?: No  Folllow up appointments reviewed: PCP Follow-up appointment confirmed?: No (pt needs to be seen prior to 01-18-23- no appts available - will ask front desk to call pt to schedule fu appt) MD Provider Line Number:[phone] Given: Yes Follow-up Provider: Dr Redmond Pulling [facility] Follow-up appointment confirmed?: No Do you need transportation to your follow-up appointment?: No Do you understand care options if your condition(s) worsen?: Yes-patient verbalized understanding    Hanson LPN [place]  [phone]

## 2023-01-20 ENCOUNTER — Inpatient Hospital Stay: Payer: Commercial Managed Care - HMO | Admitting: Family Medicine

## 2023-01-27 ENCOUNTER — Ambulatory Visit (INDEPENDENT_AMBULATORY_CARE_PROVIDER_SITE_OTHER): Payer: Commercial Managed Care - HMO | Admitting: Family Medicine

## 2023-01-27 VITALS — BP 123/83 | HR 66 | Temp 98.1°F | Resp 16 | Wt 162.0 lb

## 2023-01-27 DIAGNOSIS — D869 Sarcoidosis, unspecified: Secondary | ICD-10-CM | POA: Diagnosis not present

## 2023-01-27 DIAGNOSIS — Z09 Encounter for follow-up examination after completed treatment for conditions other than malignant neoplasm: Secondary | ICD-10-CM | POA: Diagnosis not present

## 2023-01-27 DIAGNOSIS — L03213 Periorbital cellulitis: Secondary | ICD-10-CM

## 2023-01-27 DIAGNOSIS — I10 Essential (primary) hypertension: Secondary | ICD-10-CM

## 2023-01-31 ENCOUNTER — Encounter: Payer: Self-pay | Admitting: Family Medicine

## 2023-01-31 NOTE — Progress Notes (Signed)
Established Patient Office Visit  Subjective    Patient ID: Julie Dyer, female    DOB: 02/15/1961  Age: 62 y.o. MRN: [id number]  CC:  Chief Complaint  Patient presents with   Follow-up    hospital    HPI [name] presents for follow up of hospital discharge where patient  reports she was seen for cellulitis of the eye. She also reports that she was dx with sarcoidosis. She reports improvements and denies acute complaints.    Outpatient Encounter Medications as of 01/27/2023  Medication Sig   amLODipine (NORVASC) 10 MG tablet Take 1 tablet (10 mg total) by mouth daily.   brimonidine (ALPHAGAN) 0.2 % ophthalmic solution Place 1 drop into the left eye 2 (two) times daily.   Cholecalciferol (VITAMIN D-1000 MAX ST) 25 MCG (1000 UT) tablet Take 1,000 Units by mouth daily.   dibucaine (NUPERCAINAL) 1 % OINT Place 1 application rectally as needed for hemorrhoids (As needed for pain.  (no more than 30 grams of 1% ointment per day)).   dorzolamide (TRUSOPT) 2 % ophthalmic solution Apply to eye.   dorzolamide-timolol (COSOPT) 22.3-6.8 MG/ML ophthalmic solution Place 1 drop into the left eye 2 (two) times daily.   lisinopril (ZESTRIL) 40 MG tablet Take 40 mg by mouth daily.   methotrexate (RHEUMATREX) 2.5 MG tablet Take by mouth.   neomycin-polymyxin b-dexamethasone (MAXITROL) 3.5-10000-0.1 OINT Place into the left eye 2 (two) times daily.   polyethylene glycol powder (GLYCOLAX/MIRALAX) 17 GM/SCOOP powder Take 17 g by mouth daily.   predniSONE (DELTASONE) 5 MG tablet Take 5 mg by mouth daily with breakfast.   senna-docusate (SENNA PLUS) 8.6-50 MG tablet Take 1 tablet by mouth daily.   VYZULTA 0.024 % SOLN Place 1 drop into the left eye every evening.   doxycycline (VIBRAMYCIN) 100 MG capsule Take 1 capsule (100 mg total) by mouth 2 (two) times daily. (Patient not taking: Reported on 01/06/2023)   [DISCONTINUED] folic acid (FOLVITE) 1 MG tablet Take 1 tablet (1 mg total) by  mouth daily. (Patient not taking: Reported on 01/06/2023)   No facility-administered encounter medications on file as of 01/27/2023.    Past Medical History:  Diagnosis Date   Glaucoma    Hemorrhoids     Past Surgical History:  Procedure Laterality Date   COLONOSCOPY WITH PROPOFOL N/A 06/01/2021   Procedure: COLONOSCOPY WITH PROPOFOL;  Surgeon: Jackquline Denmark, MD;  Location: [facility] ENDOSCOPY;  Service: Endoscopy;  Laterality: N/A;   ESOPHAGOGASTRODUODENOSCOPY (EGD) WITH PROPOFOL N/A 09/10/2022   Procedure: ESOPHAGOGASTRODUODENOSCOPY (EGD) WITH PROPOFOL;  Surgeon: Irene Shipper, MD;  Location: [place];  Service: Gastroenterology;  Laterality: N/A;   GLAUCOMA SURGERY Left     Family History  Problem Relation Age of Onset   Diabetes Mother    Alcohol abuse Mother    Alcohol abuse Father     Social History   Socioeconomic History   Marital status: Single    Spouse name: Not on file   Number of children: Not on file   Years of education: Not on file   Highest education level: Master's degree (e.g., MA, MS, MEng, MEd, MSW, MBA)  Occupational History   Not on file  Tobacco Use   Smoking status: Never   Smokeless tobacco: Never  Vaping Use   Vaping Use: Never used  Substance and Sexual Activity   Alcohol use: Not Currently   Drug use: Not Currently   Sexual activity: Not on file  Other Topics Concern  Not on file  Social History Narrative   Not on file   Social Determinants of Health   Financial Resource Strain: High Risk (01/27/2023)   Overall Financial Resource Strain (CARDIA)    Difficulty of Paying Living Expenses: Very hard  Food Insecurity: Food Insecurity Present (01/27/2023)   Hunger Vital Sign    Worried About Running Out of Food in the Last Year: Sometimes true    Ran Out of Food in the Last Year: Sometimes true  Transportation Needs: No Transportation Needs (01/27/2023)   PRAPARE - Hydrologist (Medical): No    Lack of  Transportation (Non-Medical): No  Physical Activity: Unknown (01/27/2023)   Exercise Vital Sign    Days of Exercise per Week: 0 days    Minutes of Exercise per Session: Not on file  Stress: No Stress Concern Present (01/27/2023)   [place]    Feeling of Stress : Only a little  Social Connections: Socially Isolated (01/27/2023)   Social Connection and Isolation Panel [NHANES]    Frequency of Communication with Friends and Family: Never    Frequency of Social Gatherings with Friends and Family: Never    Attends Religious Services: Never    Marine scientist or Organizations: No    Attends Music therapist: Not on file    Marital Status: Never married  Intimate Partner Violence: Not on file    Review of Systems  All other systems reviewed and are negative.       Objective    BP 123/83   Pulse 66   Temp 98.1 F (36.7 C) (Oral)   Resp 16   Wt 162 lb (73.5 kg)   SpO2 98%   BMI 26.55 kg/m   Physical Exam Vitals and nursing note reviewed.  Constitutional:      General: She is not in acute distress. Eyes:     Comments: Left eyelids with some swelling  Cardiovascular:     Rate and Rhythm: Normal rate and regular rhythm.  Pulmonary:     Effort: Pulmonary effort is normal.     Breath sounds: Normal breath sounds.  Abdominal:     Palpations: Abdomen is soft.     Tenderness: There is no abdominal tenderness.  Neurological:     General: No focal deficit present.     Mental Status: She is alert and oriented to person, place, and time.         Assessment & Plan:   1. Sarcoidosis Management as per consultant  2. Essential hypertension Appears stable. continue  3. Preseptal cellulitis of left eye Management as per consultant. Improving  4. Hospital discharge follow-up     Return in about 3 months (around 04/29/2023) for follow up, physical.   Becky Sax, MD

## 2023-04-15 ENCOUNTER — Telehealth: Payer: Self-pay | Admitting: *Deleted

## 2023-04-15 NOTE — Telephone Encounter (Signed)
Patient called and given appt for labs   Copied from CRM [id number]. Topic: General - Inquiry >> Apr 15, 2023 12:56 PM [place] wrote: Reason for CRM: Pt stated Rheumatologist at [facility], Dr. Chauncy Passy, advised her to call Doctor Andrey Campanile and ask if she would do blood work for the patient.  Pt stated she has a list of blood work she needs done.  CRP,CBC,CMP,ESR,  Needs this as soon as possible and has an appointment at [facility] Friday. Within 1-2 weeks after the first set needs CRP,CBC again due to the increased dose of prednisone, they want to make sure the liver and blood haven't been affected by the dosage.  Please advise.

## 2023-04-21 ENCOUNTER — Other Ambulatory Visit: Payer: Commercial Managed Care - HMO

## 2023-04-22 ENCOUNTER — Other Ambulatory Visit: Payer: Self-pay | Admitting: *Deleted

## 2023-04-22 ENCOUNTER — Other Ambulatory Visit: Payer: Commercial Managed Care - HMO

## 2023-04-22 DIAGNOSIS — D869 Sarcoidosis, unspecified: Secondary | ICD-10-CM

## 2023-04-23 LAB — CMP14+EGFR
ALT: 11 IU/L (ref 0–32)
AST: 12 IU/L (ref 0–40)
Albumin: 4.3 g/dL (ref 3.8–4.9)
Alkaline Phosphatase: 41 IU/L — ABNORMAL LOW (ref 44–121)
BUN/Creatinine Ratio: 12 (ref 12–28)
BUN: 10 mg/dL (ref 8–27)
Bilirubin Total: 0.6 mg/dL (ref 0.0–1.2)
CO2: 25 mmol/L (ref 20–29)
Calcium: 9.3 mg/dL (ref 8.7–10.3)
Chloride: 106 mmol/L (ref 96–106)
Creatinine, Ser: 0.86 mg/dL (ref 0.57–1.00)
Globulin, Total: 1.7 g/dL (ref 1.5–4.5)
Glucose: 71 mg/dL (ref 70–99)
Potassium: 3.7 mmol/L (ref 3.5–5.2)
Sodium: 145 mmol/L — ABNORMAL HIGH (ref 134–144)
Total Protein: 6 g/dL (ref 6.0–8.5)
eGFR: 77 mL/min/{1.73_m2} (ref >59)

## 2023-04-23 LAB — CBC WITH DIFFERENTIAL/PLATELET
Basophils Absolute: 0 10*3/uL (ref 0.0–0.2)
Basos: 0 %
EOS (ABSOLUTE): 0 10*3/uL (ref 0.0–0.4)
Eos: 0 %
Hematocrit: 34.7 % (ref 34.0–46.6)
Hemoglobin: 9.8 g/dL — ABNORMAL LOW (ref 11.1–15.9)
Immature Grans (Abs): 0.1 10*3/uL (ref 0.0–0.1)
Immature Granulocytes: 1 %
Lymphocytes Absolute: 3.5 10*3/uL — ABNORMAL HIGH (ref 0.7–3.1)
Lymphs: 36 %
MCH: 22.8 pg — ABNORMAL LOW (ref 26.6–33.0)
MCHC: 28.2 g/dL — ABNORMAL LOW (ref 31.5–35.7)
MCV: 81 fL (ref 79–97)
Monocytes Absolute: 0.8 10*3/uL (ref 0.1–0.9)
Monocytes: 8 %
Neutrophils Absolute: 5.4 10*3/uL (ref 1.4–7.0)
Neutrophils: 55 %
Platelets: 310 10*3/uL (ref 150–450)
RBC: 4.29 x10E6/uL (ref 3.77–5.28)
RDW: 17.3 % — ABNORMAL HIGH (ref 11.7–15.4)
WBC: 9.8 10*3/uL (ref 3.4–10.8)

## 2023-04-23 LAB — C-REACTIVE PROTEIN: CRP: <1 mg/L (ref 0–10)

## 2023-04-23 LAB — SEDIMENTATION RATE: Sed Rate: 13 mm/hr (ref 0–40)

## 2023-04-30 ENCOUNTER — Ambulatory Visit (INDEPENDENT_AMBULATORY_CARE_PROVIDER_SITE_OTHER): Payer: Commercial Managed Care - HMO | Admitting: Family Medicine

## 2023-04-30 ENCOUNTER — Other Ambulatory Visit: Payer: Commercial Managed Care - HMO

## 2023-04-30 VITALS — BP 133/89 | HR 67 | Temp 96.8°F | Resp 16 | Ht 65.0 in | Wt 166.0 lb

## 2023-04-30 DIAGNOSIS — I10 Essential (primary) hypertension: Secondary | ICD-10-CM

## 2023-04-30 DIAGNOSIS — Z130 Encounter for screening for diseases of the blood and blood-forming organs and certain disorders involving the immune mechanism: Secondary | ICD-10-CM

## 2023-04-30 DIAGNOSIS — Z13220 Encounter for screening for lipoid disorders: Secondary | ICD-10-CM

## 2023-04-30 DIAGNOSIS — Z0000 Encounter for general adult medical examination without abnormal findings: Secondary | ICD-10-CM

## 2023-04-30 DIAGNOSIS — Z1159 Encounter for screening for other viral diseases: Secondary | ICD-10-CM

## 2023-04-30 NOTE — Progress Notes (Signed)
-  Patient is here to have annually  complete physical examination  -Care gap address -labs taken  

## 2023-05-02 ENCOUNTER — Encounter: Payer: Self-pay | Admitting: Family Medicine

## 2023-05-02 NOTE — Progress Notes (Signed)
Established Patient Office Visit  Subjective    Patient ID: Julie Dyer, female    DOB: November 15, 1960  Age: 62 y.o. MRN: [id number]  CC:  Chief Complaint  Patient presents with   Annual Exam    HPI Julie Dyer presents for routine annual exam. Patient denies acute complaints or concerns.    Outpatient Encounter Medications as of 04/30/2023  Medication Sig   amLODipine (NORVASC) 10 MG tablet Take 1 tablet (10 mg total) by mouth daily.   brimonidine (ALPHAGAN) 0.2 % ophthalmic solution Place 1 drop into the left eye 2 (two) times daily.   Cholecalciferol (VITAMIN D-1000 MAX ST) 25 MCG (1000 UT) tablet Take 1,000 Units by mouth daily.   dibucaine (NUPERCAINAL) 1 % OINT Place 1 application rectally as needed for hemorrhoids (As needed for pain.  (no more than 30 grams of 1% ointment per day)).   dorzolamide (TRUSOPT) 2 % ophthalmic solution Apply to eye.   dorzolamide-timolol (COSOPT) 22.3-6.8 MG/ML ophthalmic solution Place 1 drop into the left eye 2 (two) times daily.   folic acid (FOLVITE) 1 MG tablet Take by mouth.   lisinopril (ZESTRIL) 40 MG tablet Take 40 mg by mouth daily.   methotrexate (RHEUMATREX) 2.5 MG tablet Take by mouth.   neomycin-polymyxin b-dexamethasone (MAXITROL) 3.5-10000-0.1 OINT Place into the left eye 2 (two) times daily.   polyethylene glycol powder (GLYCOLAX/MIRALAX) 17 GM/SCOOP powder Take 17 g by mouth daily.   predniSONE (DELTASONE) 10 MG tablet Take by mouth.   predniSONE (DELTASONE) 5 MG tablet Take by mouth.   senna-docusate (SENNA PLUS) 8.6-50 MG tablet Take 1 tablet by mouth daily.   VYZULTA 0.024 % SOLN Place 1 drop into the left eye every evening.   doxycycline (VIBRAMYCIN) 100 MG capsule Take 1 capsule (100 mg total) by mouth 2 (two) times daily. (Patient not taking: Reported on 01/06/2023)   No facility-administered encounter medications on file as of 04/30/2023.    Past Medical History:  Diagnosis Date   Glaucoma     Hemorrhoids     Past Surgical History:  Procedure Laterality Date   COLONOSCOPY WITH PROPOFOL N/A 06/01/2021   Procedure: COLONOSCOPY WITH PROPOFOL;  Surgeon: Lynann Bologna, MD;  Location: [facility] ENDOSCOPY;  Service: Endoscopy;  Laterality: N/A;   ESOPHAGOGASTRODUODENOSCOPY (EGD) WITH PROPOFOL N/A 09/10/2022   Procedure: ESOPHAGOGASTRODUODENOSCOPY (EGD) WITH PROPOFOL;  Surgeon: Hilarie Fredrickson, MD;  Location: [facility] ENDOSCOPY;  Service: Gastroenterology;  Laterality: N/A;   GLAUCOMA SURGERY Left     Family History  Problem Relation Age of Onset   Diabetes Mother    Alcohol abuse Mother    Alcohol abuse Father     Social History   Socioeconomic History   Marital status: Single    Spouse name: Not on file   Number of children: Not on file   Years of education: Not on file   Highest education level: Master's degree (e.g., MA, MS, MEng, MEd, MSW, MBA)  Occupational History   Not on file  Tobacco Use   Smoking status: Never   Smokeless tobacco: Never  Vaping Use   Vaping Use: Never used  Substance and Sexual Activity   Alcohol use: Not Currently   Drug use: Not Currently   Sexual activity: Not on file  Other Topics Concern   Not on file  Social History Narrative   Not on file   Social Determinants of Health   Financial Resource Strain: High Risk (01/27/2023)   Overall Financial Resource Strain (CARDIA)  Difficulty of Paying Living Expenses: Very hard  Food Insecurity: Food Insecurity Present (01/27/2023)   Hunger Vital Sign    Worried About Running Out of Food in the Last Year: Sometimes true    Ran Out of Food in the Last Year: Sometimes true  Transportation Needs: No Transportation Needs (01/27/2023)   PRAPARE - Administrator, Civil Service (Medical): No    Lack of Transportation (Non-Medical): No  Physical Activity: Unknown (01/27/2023)   Exercise Vital Sign    Days of Exercise per Week: 0 days    Minutes of Exercise per Session: Not on file  Stress: No Stress  Concern Present (01/27/2023)   [employer] of Occupational Health - Occupational Stress Questionnaire    Feeling of Stress : Only a little  Social Connections: Socially Isolated (01/27/2023)   Social Connection and Isolation Panel [NHANES]    Frequency of Communication with Friends and Family: Never    Frequency of Social Gatherings with Friends and Family: Never    Attends Religious Services: Never    Database administrator or Organizations: No    Attends Engineer, structural: Not on file    Marital Status: Never married  Intimate Partner Violence: Not on file    Review of Systems  All other systems reviewed and are negative.       Objective    BP 133/89   Pulse 67   Temp (!) 96.8 F (36 C) (Oral)   Resp 16   Ht 5\' 5"  (1.651 m)   Wt 166 lb (75.3 kg)   SpO2 96%   BMI 27.62 kg/m   Physical Exam Vitals and nursing note reviewed.  Constitutional:      General: She is not in acute distress. HENT:     Head: Normocephalic and atraumatic.     Right Ear: Tympanic membrane, ear canal and external ear normal.     Left Ear: Tympanic membrane, ear canal and external ear normal.     Nose: Nose normal.     Mouth/Throat:     Mouth: Mucous membranes are moist.     Pharynx: Oropharynx is clear.  Eyes:     Conjunctiva/sclera: Conjunctivae normal.     Pupils: Pupils are equal, round, and reactive to light.  Neck:     Thyroid: No thyromegaly.  Cardiovascular:     Rate and Rhythm: Normal rate and regular rhythm.     Heart sounds: Normal heart sounds. No murmur heard. Pulmonary:     Effort: Pulmonary effort is normal. No respiratory distress.     Breath sounds: Normal breath sounds.  Abdominal:     General: There is no distension.     Palpations: Abdomen is soft. There is no mass.     Tenderness: There is no abdominal tenderness.  Musculoskeletal:        General: Normal range of motion.     Cervical back: Normal range of motion and neck supple.  Skin:     General: Skin is warm and dry.  Neurological:     General: No focal deficit present.     Mental Status: She is alert and oriented to person, place, and time.  Psychiatric:        Mood and Affect: Mood normal.        Behavior: Behavior normal.         Assessment & Plan:   1. Annual physical exam   2. Essential hypertension   3. Need for hepatitis C  screening test   4. Screening for lipid disorders   5. Screening for deficiency anemia   6. Screening for endocrine/metabolic/immunity disorders     No follow-ups on file.   Tommie Raymond, MD

## 2023-05-07 ENCOUNTER — Other Ambulatory Visit: Payer: Commercial Managed Care - HMO

## 2023-05-09 ENCOUNTER — Other Ambulatory Visit: Payer: Commercial Managed Care - HMO

## 2023-05-09 DIAGNOSIS — D869 Sarcoidosis, unspecified: Secondary | ICD-10-CM

## 2023-05-10 LAB — CMP14+EGFR
ALT: 13 IU/L (ref 0–32)
AST: 12 IU/L (ref 0–40)
Albumin: 4.2 g/dL (ref 3.8–4.9)
Alkaline Phosphatase: 37 IU/L — ABNORMAL LOW (ref 44–121)
BUN/Creatinine Ratio: 8 — ABNORMAL LOW (ref 12–28)
BUN: 8 mg/dL (ref 8–27)
Bilirubin Total: 1.1 mg/dL (ref 0.0–1.2)
CO2: 23 mmol/L (ref 20–29)
Calcium: 9.5 mg/dL (ref 8.7–10.3)
Chloride: 104 mmol/L (ref 96–106)
Creatinine, Ser: 0.95 mg/dL (ref 0.57–1.00)
Globulin, Total: 1.9 g/dL (ref 1.5–4.5)
Glucose: 88 mg/dL (ref 70–99)
Potassium: 3.7 mmol/L (ref 3.5–5.2)
Sodium: 144 mmol/L (ref 134–144)
Total Protein: 6.1 g/dL (ref 6.0–8.5)
eGFR: 69 mL/min/{1.73_m2} (ref >59)

## 2023-05-10 LAB — CBC WITH DIFFERENTIAL/PLATELET

## 2023-05-14 ENCOUNTER — Telehealth: Payer: Self-pay | Admitting: Family Medicine

## 2023-05-14 ENCOUNTER — Other Ambulatory Visit: Payer: Self-pay | Admitting: Family Medicine

## 2023-05-14 DIAGNOSIS — Z130 Encounter for screening for diseases of the blood and blood-forming organs and certain disorders involving the immune mechanism: Secondary | ICD-10-CM

## 2023-05-14 NOTE — Telephone Encounter (Signed)
Patient was called and understand that she will need to get CBC redone

## 2023-05-14 NOTE — Telephone Encounter (Unsigned)
Copied from CRM [id number]. Topic: General - Other >> May 13, 2023  5:43 PM Everette C wrote: Reason for CRM: The patient has returned missed call regarding their lab results  The patient has a temporary phone number of [phone] Please contact the patient further when possible

## 2023-06-03 ENCOUNTER — Ambulatory Visit: Payer: Commercial Managed Care - HMO

## 2023-06-03 DIAGNOSIS — Z0000 Encounter for general adult medical examination without abnormal findings: Secondary | ICD-10-CM

## 2023-06-07 DIAGNOSIS — D869 Sarcoidosis, unspecified: Secondary | ICD-10-CM | POA: Insufficient documentation

## 2023-09-05 ENCOUNTER — Telehealth: Payer: Self-pay | Admitting: Family Medicine

## 2023-10-14 ENCOUNTER — Telehealth: Payer: Self-pay | Admitting: Family Medicine

## 2024-03-11 ENCOUNTER — Other Ambulatory Visit (HOSPITAL_BASED_OUTPATIENT_CLINIC_OR_DEPARTMENT_OTHER): Payer: Self-pay | Admitting: Family Medicine

## 2024-03-11 DIAGNOSIS — Z1231 Encounter for screening mammogram for malignant neoplasm of breast: Secondary | ICD-10-CM

## 2024-03-13 ENCOUNTER — Encounter (HOSPITAL_COMMUNITY): Payer: Self-pay | Admitting: Emergency Medicine

## 2024-03-13 ENCOUNTER — Observation Stay (HOSPITAL_COMMUNITY)
Admission: EM | Admit: 2024-03-13 | Discharge: 2024-03-15 | Disposition: A | Discharge status: Home / Self Care | Attending: Family Medicine | Admitting: Family Medicine

## 2024-03-13 ENCOUNTER — Other Ambulatory Visit: Payer: Self-pay

## 2024-03-13 DIAGNOSIS — H409 Unspecified glaucoma: Secondary | ICD-10-CM | POA: Diagnosis not present

## 2024-03-13 DIAGNOSIS — Z79899 Other long term (current) drug therapy: Secondary | ICD-10-CM | POA: Insufficient documentation

## 2024-03-13 DIAGNOSIS — I10 Essential (primary) hypertension: Secondary | ICD-10-CM | POA: Insufficient documentation

## 2024-03-13 DIAGNOSIS — D500 Iron deficiency anemia secondary to blood loss (chronic): Secondary | ICD-10-CM

## 2024-03-13 DIAGNOSIS — K625 Hemorrhage of anus and rectum: Secondary | ICD-10-CM | POA: Diagnosis present

## 2024-03-13 DIAGNOSIS — K922 Gastrointestinal hemorrhage, unspecified: Secondary | ICD-10-CM | POA: Diagnosis not present

## 2024-03-13 DIAGNOSIS — K648 Other hemorrhoids: Secondary | ICD-10-CM | POA: Insufficient documentation

## 2024-03-13 DIAGNOSIS — D649 Anemia, unspecified: Secondary | ICD-10-CM | POA: Diagnosis not present

## 2024-03-13 DIAGNOSIS — K649 Unspecified hemorrhoids: Secondary | ICD-10-CM | POA: Diagnosis present

## 2024-03-13 LAB — COMPREHENSIVE METABOLIC PANEL WITH GFR
ALT: 14 U/L (ref 0–44)
AST: 18 U/L (ref 15–41)
Albumin: 3.7 g/dL (ref 3.5–5.0)
Alkaline Phosphatase: 38 U/L (ref 38–126)
Anion gap: 8 (ref 5–15)
BUN: 5 mg/dL — ABNORMAL LOW (ref 8–23)
CO2: 22 mmol/L (ref 22–32)
Calcium: 9.3 mg/dL (ref 8.9–10.3)
Chloride: 108 mmol/L (ref 98–111)
Creatinine, Ser: 0.65 mg/dL (ref 0.44–1.00)
GFR, Estimated: >60 mL/min (ref >60)
Glucose, Bld: 101 mg/dL — ABNORMAL HIGH (ref 70–99)
Potassium: 3.9 mmol/L (ref 3.5–5.1)
Sodium: 138 mmol/L (ref 135–145)
Total Bilirubin: 1.7 mg/dL — ABNORMAL HIGH (ref 0.0–1.2)
Total Protein: 6.1 g/dL — ABNORMAL LOW (ref 6.5–8.1)

## 2024-03-13 LAB — CBC
HCT: 18.3 % — ABNORMAL LOW (ref 36.0–46.0)
Hemoglobin: 4.6 g/dL — CL (ref 12.0–15.0)
MCH: 18.7 pg — ABNORMAL LOW (ref 26.0–34.0)
MCHC: 25.1 g/dL — ABNORMAL LOW (ref 30.0–36.0)
MCV: 74.4 fL — ABNORMAL LOW (ref 80.0–100.0)
Platelets: 345 10*3/uL (ref 150–400)
RBC: 2.46 MIL/uL — ABNORMAL LOW (ref 3.87–5.11)
RDW: 24.3 % — ABNORMAL HIGH (ref 11.5–15.5)
WBC: 5.2 10*3/uL (ref 4.0–10.5)
nRBC: 0 % (ref 0.0–0.2)

## 2024-03-13 LAB — RETICULOCYTES
Immature Retic Fract: 15.1 % (ref 2.3–15.9)
RBC.: 2.17 MIL/uL — ABNORMAL LOW (ref 3.87–5.11)
Retic Count, Absolute: 32.3 10*3/uL (ref 19.0–186.0)
Retic Ct Pct: 1.5 % (ref 0.4–3.1)

## 2024-03-13 LAB — IRON AND TIBC
Iron: 13 ug/dL — ABNORMAL LOW (ref 28–170)
Saturation Ratios: 3 % — ABNORMAL LOW (ref 10.4–31.8)
TIBC: 416 ug/dL (ref 250–450)
UIBC: 403 ug/dL

## 2024-03-13 LAB — FERRITIN: Ferritin: 6 ng/mL — ABNORMAL LOW (ref 11–307)

## 2024-03-13 LAB — PREPARE RBC (CROSSMATCH)

## 2024-03-13 LAB — HIV ANTIBODY (ROUTINE TESTING W REFLEX): HIV Screen 4th Generation wRfx: NONREACTIVE

## 2024-03-13 MED ORDER — ACETAMINOPHEN 325 MG PO TABS: 650.0000 mg | ORAL_TABLET | Freq: Four times a day (QID) | ORAL | Status: DC | PRN

## 2024-03-13 MED ORDER — ACETAMINOPHEN 650 MG RE SUPP: 650.0000 mg | Freq: Four times a day (QID) | RECTAL | Status: DC | PRN

## 2024-03-13 MED ORDER — LATANOPROST 0.005 % OP SOLN
1.0000 [drp] | Freq: Every day | OPHTHALMIC | Status: DC
  Filled 2024-03-13: qty 2.5

## 2024-03-13 MED ORDER — DORZOLAMIDE HCL-TIMOLOL MAL 2-0.5 % OP SOLN
1.0000 [drp] | Freq: Two times a day (BID) | OPHTHALMIC | Status: DC
  Administered 2024-03-14 – 2024-03-15 (×3): 1 [drp] via OPHTHALMIC
  Filled 2024-03-13: qty 10

## 2024-03-13 MED ORDER — SODIUM CHLORIDE 0.9% FLUSH
3.0000 mL | Freq: Two times a day (BID) | INTRAVENOUS | Status: DC
Start: 2024-03-13 — End: 2024-03-15
  Administered 2024-03-14 – 2024-03-15 (×2): 3 mL via INTRAVENOUS

## 2024-03-13 MED ORDER — METHOTREXATE SODIUM 2.5 MG PO TABS: 25.0000 mg | ORAL_TABLET | ORAL | Status: DC

## 2024-03-13 MED ORDER — SODIUM CHLORIDE 0.9% IV SOLUTION: Freq: Once | INTRAVENOUS | Status: AC

## 2024-03-13 NOTE — H&P (Signed)
 History and Physical   Julie Dyer EXB:[id number] DOB: [birth year] DOA: 03/13/2024  PCP: Abraham Abo, MD   Patient coming from: Home  Chief Complaint: Rectal bleeding  HPI: Julie Dyer is a 63 y.o. female with medical history significant of hypertension, anemia, GI bleed, hemorrhoids, glaucoma presenting with rectal bleeding.  Patient reports 4 days of bright red blood per rectum with worsening fatigue, lightheadedness, shortness of breath especially over the last day.  Known history of severe hemorrhoids with significant hemorrhoidal bleeding in the past.   symptoms are similar to previous admissions for this.  Previously was recommended for hemorrhoidectomy but was lost to follow-up.   Denies fevers, chills, chest pain, abdominal pain, nausea, vomiting.  ED Course: Vital signs in the ED have remained stable, blood pressure in the 110s systolic.  Lab workup included CMP with glucose 101, protein 6.1.  CBC with hemoglobin of 4.6 down from previous of 9.48 months ago with MCV of 74.  FOBT positive.  Patient typed and screened in the ED.  3 units ordered for transfusion.  GI consulted and given previous known high-grade hemorrhoids with recommendation for hemorrhoidectomy they recommended to continue with transfusion and consult general surgery.  General surgery consulted and EDP is awaiting call back.  Review of Systems: As per HPI otherwise all other systems reviewed and are negative.  Past Medical History:  Diagnosis Date   Glaucoma    Hemorrhoids     Past Surgical History:  Procedure Laterality Date   COLONOSCOPY WITH PROPOFOL  N/A 06/01/2021   Procedure: COLONOSCOPY WITH PROPOFOL ;  Surgeon: Lajuan Pila, MD;  Location: [facility] ENDOSCOPY;  Service: Endoscopy;  Laterality: N/A;   ESOPHAGOGASTRODUODENOSCOPY (EGD) WITH PROPOFOL  N/A 09/10/2022   Procedure: ESOPHAGOGASTRODUODENOSCOPY (EGD) WITH PROPOFOL ;  Surgeon: Tobin Forts, MD;  Location: [facility] ENDOSCOPY;  Service:  Gastroenterology;  Laterality: N/A;   GLAUCOMA SURGERY Left     Social History  reports that she has never smoked. She has never used smokeless tobacco. She reports that she does not currently use alcohol. She reports that she does not currently use drugs.  No Known Allergies  Family History  Problem Relation Age of Onset   Diabetes Mother    Alcohol abuse Mother    Alcohol abuse Father   Reviewed on admission  Prior to Admission medications   Medication Sig Start Date End Date Taking? Authorizing Provider  Cholecalciferol (VITAMIN D-1000 MAX ST) 25 MCG (1000 UT) tablet Take 1,000 Units by mouth daily. 01/04/23  Yes [provider]  dorzolamide -timolol  (COSOPT ) 22.3-6.8 MG/ML ophthalmic solution Place 1 drop into the left eye 2 (two) times daily. 05/12/20  Yes [provider]  folic acid  (FOLVITE ) 1 MG tablet Take by mouth. 04/10/23 04/09/24 Yes [provider]  latanoprost  (XALATAN ) 0.005 % ophthalmic solution Place 1 drop into the left eye at bedtime. 12/31/23 12/30/24 Yes [provider]  methotrexate (RHEUMATREX) 2.5 MG tablet Take 25 mg by mouth once a week. On Thursdays 12/24/23 03/23/24 Yes [provider]    Physical Exam: Vitals:   03/13/24 1423 03/13/24 1441  BP: (!) 119/92   Pulse: 93   Resp: 18   Temp: 98.4 F (36.9 C)   SpO2: 100%   Weight:  70.3 kg  Height:  5\' 5"  (1.651 m)    Physical Exam Constitutional:      General: She is not in acute distress.    Appearance: Normal appearance.  HENT:     Head: Normocephalic and atraumatic.  Mouth/Throat:     Mouth: Mucous membranes are moist.     Pharynx: Oropharynx is clear.  Eyes:     Extraocular Movements: Extraocular movements intact.     Pupils: Pupils are equal, round, and reactive to light.  Cardiovascular:     Rate and Rhythm: Normal rate and regular rhythm.     Pulses: Normal pulses.     Heart sounds: Normal heart sounds.  Pulmonary:     Effort: Pulmonary effort is  normal. No respiratory distress.     Breath sounds: Normal breath sounds.  Abdominal:     General: Bowel sounds are normal. There is no distension.     Palpations: Abdomen is soft.     Tenderness: There is no abdominal tenderness.  Musculoskeletal:        General: No swelling or deformity.  Skin:    General: Skin is warm and dry.  Neurological:     General: No focal deficit present.     Mental Status: Mental status is at baseline.    Labs on Admission: I have personally reviewed following labs and imaging studies  CBC: Recent Labs  Lab 03/13/24 1458  WBC 5.2  HGB 4.6*  HCT 18.3*  MCV 74.4*  PLT 345    Basic Metabolic Panel: Recent Labs  Lab 03/13/24 1458  NA 138  K 3.9  CL 108  CO2 22  GLUCOSE 101*  BUN 5*  CREATININE 0.65  CALCIUM  9.3    GFR: Estimated Creatinine Clearance: 72.6 mL/min (by C-G formula based on SCr of 0.65 mg/dL).  Liver Function Tests: Recent Labs  Lab 03/13/24 1458  AST 18  ALT 14  ALKPHOS 38  BILITOT 1.7*  PROT 6.1*  ALBUMIN 3.7    Urine analysis: No results found for: "COLORURINE", "APPEARANCEUR", "LABSPEC", "PHURINE", "GLUCOSEU", "HGBUR", "BILIRUBINUR", "KETONESUR", "PROTEINUR", "UROBILINOGEN", "NITRITE", "LEUKOCYTESUR"  Radiological Exams on Admission: No results found.  EKG: Independently reviewed.  Sinus rhythm at 90 beats minute.  Some baseline wander/artifact.  Low voltage aVL.  Nonspecific T wave changes.  Assessment/Plan Active Problems:   Anemia   Hemorrhoids   Glaucoma (increased eye pressure)   Essential hypertension   Rectal bleeding Hemorrhoids Iron deficiency Symptomatic anemia > Presenting with worsening symptomatic anemia in the setting of bright red blood per rectum x 4 days. > Now with worsening fatigue, lightheadedness, shortness of breath.  History of prior significant hemorrhoidal bleeding. > Previously hemorrhoidectomy had been recommended but patient was lost to follow-up. > Hemoglobin now 4.6  and baseline of 9.4 with MCV of 74.  Likely commendation of ongoing bleeding and iron deficiency. > GI consulted and recommended general surgery consult and transfusion given patient's hemorrhoidal history and need for hemorrhoidectomy. > General Surgery consulted and EDP waiting to hear back - Monitor on progressive unit - Appreciate gastroenterology and general surgery recommendations and assistance - Continue 3 units transfusion - Trend CBC - Check iron, ferritin, reticulocytes - N.p.o.  Hypertension - Not on any medication for this  Glaucoma - Continue home Cosopt  and Xalatan    DVT prophylaxis: SCDs Code Status:   Full Family Communication:  None on admission  Disposition Plan:   Patient is from:  Home  Anticipated DC to:  Home  Anticipated DC date:  2 to 5 days  Anticipated DC barriers: None  Consults called:  Gastroenterology, general surgery Admission status:  Inpatient, progressive  Severity of Illness: The appropriate patient status for this patient is INPATIENT. Inpatient status is judged to be reasonable and necessary  in order to provide the required intensity of service to ensure the patient's safety. The patient's presenting symptoms, physical exam findings, and initial radiographic and laboratory data in the context of their chronic comorbidities is felt to place them at high risk for further clinical deterioration. Furthermore, it is not anticipated that the patient will be medically stable for discharge from the hospital within 2 midnights of admission.   * I certify that at the point of admission it is my clinical judgment that the patient will require inpatient hospital care spanning beyond 2 midnights from the point of admission due to high intensity of service, high risk for further deterioration and high frequency of surveillance required.Johnetta Nab MD [facility]  How to contact the [facility] Attending or Consulting provider 7A - 7P or covering  provider during after hours 7P -7A, for this patient?   Check the care team in [facility] and look for a) attending/consulting [facility] provider listed and b) the [facility] team listed Log into [email or web address] and use [facility]'s universal password to access. If you do not have the password, please contact the hospital operator. Locate the [facility] provider you are looking for under [facility] and page to a number that you can be directly reached. If you still have difficulty reaching the provider, please page the [facility] (Director on Call) for the Hospitalists listed on amion for assistance.  03/13/2024, 4:32 PM

## 2024-03-13 NOTE — Consult Note (Signed)
 Julie Dyer 07/08/1961  [id number].    Requesting MD: Jerald Molly Chief Complaint/Reason for Consult: bleeding hemorrhoids  HPI:  63 yo female with known hemorrhoid disease began having more bleeding 3 days ago. IT has slowly improved now. She will have bouts of constipation that lead to bleeding for multiple days.   ROS: Review of Systems  Constitutional:  Positive for malaise/fatigue.  HENT: Negative.    Eyes: Negative.   Respiratory: Negative.    Cardiovascular: Negative.   Gastrointestinal:  Positive for blood in stool.  Genitourinary: Negative.   Musculoskeletal: Negative.   Skin: Negative.   Neurological: Negative.   Endo/Heme/Allergies: Negative.   Psychiatric/Behavioral: Negative.      Family History  Problem Relation Age of Onset   Diabetes Mother    Alcohol abuse Mother    Alcohol abuse Father     Past Medical History:  Diagnosis Date   Glaucoma    Hemorrhoids     Past Surgical History:  Procedure Laterality Date   COLONOSCOPY WITH PROPOFOL  N/A 06/01/2021   Procedure: COLONOSCOPY WITH PROPOFOL ;  Surgeon: Lajuan Pila, MD;  Location[facility] ENDOSCOPY;  Service: Endoscopy;  Laterality: N/A;   ESOPHAGOGASTRODUODENOSCOPY (EGD) WITH PROPOFOL  N/A 09/10/2022   Procedure: ESOPHAGOGASTRODUODENOSCOPY (EGD) WITH PROPOFOL ;  Surgeon: Tobin Forts, MD;  Location: [facility] ENDOSCOPY;  Service: Gastroenterology;  Laterality: N/A;   GLAUCOMA SURGERY Left     Social History:  reports that she has never smoked. She has never used smokeless tobacco. She reports that she does not currently use alcohol. She reports that she does not currently use drugs.  Allergies: No Known Allergies  Medications Prior to Admission  Medication Sig Dispense Refill   Cholecalciferol (VITAMIN D-1000 MAX ST) 25 MCG (1000 UT) tablet Take 1,000 Units by mouth daily.     dorzolamide -timolol  (COSOPT ) 22.3-6.8 MG/ML ophthalmic solution Place 1 drop into the left eye 2 (two) times daily.      folic acid  (FOLVITE ) 1 MG tablet Take by mouth.     latanoprost  (XALATAN ) 0.005 % ophthalmic solution Place 1 drop into the left eye at bedtime.     methotrexate (RHEUMATREX) 2.5 MG tablet Take 25 mg by mouth once a week. On Thursdays      Physical Exam: Blood pressure (!) 152/90, pulse 85, temperature 98.2 F (36.8 C), temperature source Oral, resp. rate 18, height 5\' 5"  (1.651 m), weight 64.5 kg, SpO2 100%. Gen: NAd Resp: nonlabored CV: RRR Abd: soft, NT Rectal - large amount of external hemorrhoid tissue, no active bleeding Neuro: AOx4  Results for orders placed or performed during the hospital encounter of 03/13/24 (from the past 48 hours)  Type and screen [facility]     Status: None (Preliminary result)   Collection Time: 03/13/24  2:46 PM  Result Value Ref Range   ABO/RH(D) O POS    Antibody Screen NEG    Sample Expiration 03/16/2024,2359    Unit Number [id number]    Blood Component Type RED CELLS,LR    Unit division 00    Status of Unit ISSUED    Transfusion Status OK TO TRANSFUSE    Crossmatch Result Compatible    Unit Number [id number]    Blood Component Type RED CELLS,LR    Unit division 00    Status of Unit ISSUED    Transfusion Status OK TO TRANSFUSE    Crossmatch Result Compatible    Unit Number [id number]    Blood Component Type RED CELLS,LR  Unit division 00    Status of Unit ISSUED    Transfusion Status OK TO TRANSFUSE    Crossmatch Result      Compatible Performed at [facility], [address] [place], [place] [address]   Comprehensive metabolic panel     Status: Abnormal   Collection Time: 03/13/24  2:58 PM  Result Value Ref Range   Sodium 138 135 - 145 mmol/L   Potassium 3.9 3.5 - 5.1 mmol/L   Chloride 108 98 - 111 mmol/L   CO2 22 22 - 32 mmol/L   Glucose, Bld 101 (H) 70 - 99 mg/dL    Comment: Glucose reference range applies only to samples taken after fasting for at least 8 hours.   BUN 5 (L) 8 - 23 mg/dL    Creatinine, Ser 9.56 0.44 - 1.00 mg/dL   Calcium  9.3 8.9 - 10.3 mg/dL   Total Protein 6.1 (L) 6.5 - 8.1 g/dL   Albumin 3.7 3.5 - 5.0 g/dL   AST 18 15 - 41 U/L   ALT 14 0 - 44 U/L   Alkaline Phosphatase 38 38 - 126 U/L   Total Bilirubin 1.7 (H) 0.0 - 1.2 mg/dL   GFR, Estimated >21 >30 mL/min    Comment: (NOTE) Calculated using the CKD-EPI Creatinine Equation (2021)    Anion gap 8 5 - 15    Comment: Performed at [facility], [address], [place], [place] [address]  CBC     Status: Abnormal   Collection Time: 03/13/24  2:58 PM  Result Value Ref Range   WBC 5.2 4.0 - 10.5 K/uL   RBC 2.46 (L) 3.87 - 5.11 MIL/uL   Hemoglobin 4.6 (LL) 12.0 - 15.0 g/dL    Comment: REPEATED TO VERIFY Reticulocyte Hemoglobin testing may be clinically indicated, consider ordering this additional test [id number] THIS CRITICAL RESULT HAS VERIFIED AND BEEN CALLED TO NEWTON JOSHUA RN BY SOUMEYATOU OUROBANGNA ON 05 10 2025 AT 1530, AND HAS BEEN READ BACK.     HCT 18.3 (L) 36.0 - 46.0 %   MCV 74.4 (L) 80.0 - 100.0 fL   MCH 18.7 (L) 26.0 - 34.0 pg   MCHC 25.1 (L) 30.0 - 36.0 g/dL   RDW 84.1 (H) 32.4 - 40.1 %   Platelets 345 150 - 400 K/uL    Comment: REPEATED TO VERIFY   nRBC 0.0 0.0 - 0.2 %    Comment: Performed at [facility], [address], [place], [place] [address]  Prepare RBC (crossmatch)     Status: None   Collection Time: 03/13/24  4:00 PM  Result Value Ref Range   Order Confirmation      ORDER PROCESSED BY BLOOD BANK Performed at [facility], [address] [place], [place] [address]   Iron and TIBC     Status: Abnormal   Collection Time: 03/13/24  5:00 PM  Result Value Ref Range   Iron 13 (L) 28 - 170 ug/dL   TIBC 034 742 - 595 ug/dL   Saturation Ratios 3 (L) 10.4 - 31.8 %   UIBC 403 ug/dL    Comment: Performed at [facility], [address], [place], [place] [address]  Ferritin     Status: Abnormal   Collection Time: 03/13/24  5:00 PM  Result Value Ref  Range   Ferritin 6 (L) 11 - 307 ng/mL    Comment: Performed at [facility], [address],  [place], [place] [address]  Reticulocytes     Status: Abnormal   Collection Time: 03/13/24  5:00 PM  Result Value Ref Range   Retic Ct Pct 1.5 0.4 - 3.1 %   RBC. 2.17 (L) 3.87 - 5.11 MIL/uL   Retic Count, Absolute 32.3 19.0 - 186.0 K/uL   Immature Retic Fract 15.1 2.3 - 15.9 %    Comment: Performed at [facility], [address], [place], [place] [address]  HIV Antibody (routine testing w rflx)     Status: None   Collection Time: 03/13/24  5:00 PM  Result Value Ref Range   HIV Screen 4th Generation wRfx Non Reactive Non Reactive    Comment: Performed at [facility], [address], [place], [place] [address]   No results found.  Assessment/Plan 63 yo female with severe anemia related to hemorrhoids -surgery will follow along. I think hemorrhoid surgery could be useful in prevention of next bleed. -Currently, no active bleeding -patient is underutilizing constipation treatments which could also help to avoid these hospitalizations  FEN - NPO VTE - holding prophylaxis ID - no issues Admit - admitted to medicine  I reviewed last 24 h vitals and pain scores, last 48 h intake and output, last 24 h labs and trends, and last 24 h imaging results.  Alphonso Aschoff [facility] 03/13/2024, 8:55 PM Please see Amion for pager number during day hours 7:00am-4:30pm or 7:00am -11:30am on weekends

## 2024-03-13 NOTE — ED Triage Notes (Signed)
 Pt states has been having rectal bleeding from her hemorrhoids since Thursday. Pt reports feeling fatigued, SOB. Appears jaundiced, and pale.

## 2024-03-13 NOTE — Consult Note (Signed)
 Chart reviewed and case discussed with ER attending.  63 year old female with symptomatic hemorrhoids complicated by iron deficiency anemia.  She has had 2 prior admissions for profound iron deficiency anemia secondary to bleeding hemorrhoids.  Initially admission for this was in 05/2021.  Colonoscopy on 06/01/2021 with large grade 4 internal/external hemorrhoids, nonbleeding sigmoid diverticulosis, normal ileum.  Had recommended surgical consult for intervention on hemorrhoids, but was ultimately treated medically.  Readmission in 09/2022 again with iron deficiency anemia.  Inpatient GI service performed EGD to ensure no confounding UGI source, which was normal (small hiatal hernia).  Again consulted surgery for intervention of hemorrhoids.  Now presenting with symptomatic anemia with H/H 4.6/18.3 and MCV/RDW 74/24.  Almost certainly recurrence of iron deficiency anemia given her downtrend of MCV (baseline Hgb ~9.4-9.8 with MCV in the low 80s).  I reviewed the endoscopic images from her previous colonoscopy.  These hemorrhoids are severe, grade 4, and will not be amenable to hemorrhoid band ligation.  There is no role for repeat endoscopic evaluation as she has been admitted and evaluated with both EGD and colonoscopy in the past for these same symptoms.  I recommend surgical consult for consideration of hemorrhoidectomy on this admission as this is now 3 admissions for severe, iron deficiency anemia requiring RBC transfusion.    - PRBC transfusion - Check iron panel with IV iron as appropriate - Inpatient GI service will otherwise be available as needed  Harry Lindau, DO, [facility]

## 2024-03-13 NOTE — ED Provider Notes (Signed)
 [place] EMERGENCY DEPARTMENT AT [facility] Provider Note   CSN: [id number] Arrival date & time: 03/13/24  1412     History  Chief Complaint  Patient presents with   Rectal Bleeding    Julie Dyer is a 63 y.o. female with a history of large hemorrhoids and GI bleeding presented to ED with concern for fatigue, lightheadedness, bleeding in her stool.  Patient reports bright red blood in her stool for the past 4 days, feeling short of breath particular for the past 24 hours, very fatigued.  She said this feels similar to her prior hospitalization for hemorrhoidal bleeds, most recently 2023.  At that time I reviewed her external records and patient had an EGD which was unremarkable.  Colonoscopy in 2022 showed large internal and external hemorrhoids.  Patient says she was referred to GI to have hemorrhoidectomy or other procedure done but was lost to follow-up, although she is interested now and returning to them.  She denies blood thinner use.  She is here with her husband at the bedside.  HPI     Home Medications Prior to Admission medications   Medication Sig Start Date End Date Taking? Authorizing Provider  Cholecalciferol (VITAMIN D-1000 MAX ST) 25 MCG (1000 UT) tablet Take 1,000 Units by mouth daily. 01/04/23  Yes [provider]  dorzolamide -timolol  (COSOPT ) 22.3-6.8 MG/ML ophthalmic solution Place 1 drop into the left eye 2 (two) times daily. 05/12/20  Yes [provider]  folic acid  (FOLVITE ) 1 MG tablet Take by mouth. 04/10/23 04/09/24 Yes [provider]  latanoprost  (XALATAN ) 0.005 % ophthalmic solution Place 1 drop into the left eye at bedtime. 12/31/23 12/30/24 Yes [provider]  methotrexate (RHEUMATREX) 2.5 MG tablet Take 25 mg by mouth once a week. On Thursdays 12/24/23 03/23/24 Yes [provider]      Allergies    Patient has no known allergies.    Review of Systems   Review of Systems  Physical Exam Updated  Vital Signs BP (!) 119/92 (BP Location: Right Arm)   Pulse 93   Temp 98.4 F (36.9 C)   Resp 18   Ht 5\' 5"  (1.651 m)   Wt 70.3 kg   SpO2 100%   BMI 25.79 kg/m  Physical Exam Constitutional:      General: She is not in acute distress. HENT:     Head: Normocephalic and atraumatic.  Eyes:     Conjunctiva/sclera: Conjunctivae normal.     Pupils: Pupils are equal, round, and reactive to light.  Cardiovascular:     Rate and Rhythm: Normal rate and regular rhythm.  Pulmonary:     Effort: Pulmonary effort is normal. No respiratory distress.  Abdominal:     General: There is no distension.     Tenderness: There is no abdominal tenderness.  Genitourinary:    Comments: Sizable circumferential external hemorrhoids, some small amount of bleeding and friable tissue or ulceration near anus, no overt hemorrhage Skin:    General: Skin is warm and dry.  Neurological:     General: No focal deficit present.     Mental Status: She is alert. Mental status is at baseline.  Psychiatric:        Mood and Affect: Mood normal.        Behavior: Behavior normal.     ED Results / Procedures / Treatments   Labs (all labs ordered are listed, but only abnormal results are displayed) Labs Reviewed  COMPREHENSIVE METABOLIC PANEL WITH GFR -  Abnormal; Notable for the following components:      Result Value   Glucose, Bld 101 (*)    BUN 5 (*)    Total Protein 6.1 (*)    Total Bilirubin 1.7 (*)    All other components within normal limits  CBC - Abnormal; Notable for the following components:   RBC 2.46 (*)    Hemoglobin 4.6 (*)    HCT 18.3 (*)    MCV 74.4 (*)    MCH 18.7 (*)    MCHC 25.1 (*)    RDW 24.3 (*)    All other components within normal limits  IRON AND TIBC  FERRITIN  RETICULOCYTES  HIV ANTIBODY (ROUTINE TESTING W REFLEX)  CBC  COMPREHENSIVE METABOLIC PANEL WITH GFR  CBC  POC OCCULT BLOOD, ED  TYPE AND SCREEN  PREPARE RBC (CROSSMATCH)    EKG EKG  Interpretation Date/Time:  Saturday Mar 13 2024 14:55:28 EDT Ventricular Rate:  92 PR Interval:  126 QRS Duration:  74 QT Interval:  378 QTC Calculation: 467 R Axis:   61  Text Interpretation: Normal sinus rhythm Normal ECG When compared with ECG of 08-Sep-2022 14:31, PREVIOUS ECG IS PRESENT Confirmed by Jerald Molly [phone]) on 03/13/2024 3:21:35 PM  Radiology No results found.  Procedures .Critical Care  Performed by: Arvilla Birmingham, MD Authorized by: Arvilla Birmingham, MD   Critical care provider statement:    Critical care time (minutes):  45   Critical care time was exclusive of:  Separately billable procedures and treating other patients   Critical care was necessary to treat or prevent imminent or life-threatening deterioration of the following conditions:  Circulatory failure   Critical care was time spent personally by me on the following activities:  Ordering and performing treatments and interventions, ordering and review of laboratory studies, ordering and review of radiographic studies, pulse oximetry, review of old charts, examination of patient and evaluation of patient's response to treatment   Care discussed with: admitting provider   Comments:     RBC transfusion for symptomatic anemia     Medications Ordered in ED Medications  0.9 %  sodium chloride  infusion (Manually program via Guardrails IV Fluids) (has no administration in time range)  dorzolamide -timolol  (COSOPT ) 2-0.5 % ophthalmic solution 1 drop (has no administration in time range)  latanoprost  (XALATAN ) 0.005 % ophthalmic solution 1 drop (has no administration in time range)  methotrexate (RHEUMATREX) tablet 25 mg (has no administration in time range)  sodium chloride  flush (NS) 0.9 % injection 3 mL (has no administration in time range)  acetaminophen  (TYLENOL ) tablet 650 mg (has no administration in time range)    Or  acetaminophen  (TYLENOL ) suppository 650 mg (has no administration in time  range)    ED Course/ Medical Decision Making/ A&P Clinical Course as of 03/13/24 1709  Sat Mar 13, 2024  1608 [facility] consulted - they report hemorrhoids are likely too large for GI management and recommend surgery consult, which is now ordered. [MT]  1708 I spoke to Dr Aniceto Barley gen surgery who will review the case [MT]    Clinical Course User Index [MT] Roran Wegner, Janalyn Me, MD                                 Medical Decision Making Amount and/or Complexity of Data Reviewed Labs: ordered.  Risk Prescription drug management. Decision regarding hospitalization.   This patient presents to the ED  with concern for fatigue, lightheadedness, bleeding still. This involves an extensive number of treatment options, and is a complaint that carries with it a high risk of complications and morbidity.  The differential diagnosis includes symptomatic anemia from GI bleed most likely versus other illness versus metabolic derangement  Co-morbidities that complicate the patient evaluation: History of large hemorrhoidal bleeding in the past at risk of recurrence  Additional history obtained from patient's husband at the bedside  External records from outside source obtained and reviewed including prior colonoscopy and endoscopy from the past 5 years as noted above, GI consult in hospital  I ordered and personally interpreted labs.  The pertinent results include:  Hgb 4.6 (from 9.4 nine months ago)   The patient was maintained on a cardiac monitor.  I personally viewed and interpreted the cardiac monitored which showed an underlying rhythm of: sinus rhythm and sinus tachycardia  Per my interpretation the patient's ECG shows sinus tachycardia  I ordered medication including blood transfusion pRBC x 3 for significant symptomatic anemia  I have reviewed the patients home medicines and have made adjustments as needed  Test Considered: doubt acute PE  After the interventions noted above, I  reevaluated the patient and found that they have: stayed the same   Although the patient is symptomatic with her anemia she is hemodynamically stable at this time.  I have ordered blood transfusion for her and I will consult with GI.  She will need medical admission.  She and her husband are in agreement  I did speak to the on-call provider for gastroenterology, we discussed the case, and the recommendations were - surgery consult, transfuse.  I consulted with surgeon - see ed course  Dispostion:  After consideration of the diagnostic results and the patients response to treatment, I feel that the patent would benefit from medical admission         Final Clinical Impression(s) / ED Diagnoses Final diagnoses:  Rectal bleeding  Hemorrhoid prolapse  Symptomatic anemia    Rx / DC Orders ED Discharge Orders     None         Arvilla Birmingham, MD 03/13/24 1709

## 2024-03-14 DIAGNOSIS — K922 Gastrointestinal hemorrhage, unspecified: Secondary | ICD-10-CM | POA: Diagnosis not present

## 2024-03-14 LAB — BPAM RBC
Blood Product Expiration Date: 202506082359
Blood Product Expiration Date: 202506082359
Blood Product Expiration Date: 202506082359
ISSUE DATE / TIME: 202505101823
ISSUE DATE / TIME: 202505101823
ISSUE DATE / TIME: 202505101823
Unit Type and Rh: 5100
Unit Type and Rh: 5100
Unit Type and Rh: 5100

## 2024-03-14 LAB — TYPE AND SCREEN
ABO/RH(D): O POS
Antibody Screen: NEGATIVE
Unit division: 0
Unit division: 0
Unit division: 0

## 2024-03-14 LAB — COMPREHENSIVE METABOLIC PANEL WITH GFR
ALT: 12 U/L (ref 0–44)
AST: 15 U/L (ref 15–41)
Albumin: 3.5 g/dL (ref 3.5–5.0)
Alkaline Phosphatase: 34 U/L — ABNORMAL LOW (ref 38–126)
Anion gap: 11 (ref 5–15)
BUN: <5 mg/dL — ABNORMAL LOW (ref 8–23)
CO2: 21 mmol/L — ABNORMAL LOW (ref 22–32)
Calcium: 9.3 mg/dL (ref 8.9–10.3)
Chloride: 106 mmol/L (ref 98–111)
Creatinine, Ser: 0.68 mg/dL (ref 0.44–1.00)
GFR, Estimated: >60 mL/min (ref >60)
Glucose, Bld: 93 mg/dL (ref 70–99)
Potassium: 3.2 mmol/L — ABNORMAL LOW (ref 3.5–5.1)
Sodium: 138 mmol/L (ref 135–145)
Total Bilirubin: 2.3 mg/dL — ABNORMAL HIGH (ref 0.0–1.2)
Total Protein: 5.8 g/dL — ABNORMAL LOW (ref 6.5–8.1)

## 2024-03-14 LAB — CBC
HCT: 31.6 % — ABNORMAL LOW (ref 36.0–46.0)
Hemoglobin: 9.7 g/dL — ABNORMAL LOW (ref 12.0–15.0)
MCH: 24.1 pg — ABNORMAL LOW (ref 26.0–34.0)
MCHC: 30.7 g/dL (ref 30.0–36.0)
MCV: 78.4 fL — ABNORMAL LOW (ref 80.0–100.0)
Platelets: 246 10*3/uL (ref 150–400)
RBC: 4.03 MIL/uL (ref 3.87–5.11)
RDW: 19.9 % — ABNORMAL HIGH (ref 11.5–15.5)
WBC: 5.3 10*3/uL (ref 4.0–10.5)
nRBC: 0 % (ref 0.0–0.2)

## 2024-03-14 MED ORDER — HYDRALAZINE HCL 20 MG/ML IJ SOLN: 10.0000 mg | Freq: Four times a day (QID) | INTRAMUSCULAR | Status: DC | PRN

## 2024-03-14 MED ORDER — POTASSIUM CHLORIDE 10 MEQ/100ML IV SOLN
10.0000 meq | INTRAVENOUS | Status: AC
  Administered 2024-03-14 (×4): 10 meq via INTRAVENOUS
  Filled 2024-03-14 (×4): qty 100

## 2024-03-14 NOTE — Progress Notes (Addendum)
 PROGRESS NOTE    Julie Dyer  ZOX:[id number] DOB: [birth year] DOA: 03/13/2024 PCP: Abraham Abo, MD   Brief Narrative:  HPI: Julie Dyer is a 63 y.o. female with medical history significant of hypertension, anemia, GI bleed, hemorrhoids, glaucoma presenting with rectal bleeding.   Patient reports 4 days of bright red blood per rectum with worsening fatigue, lightheadedness, shortness of breath especially over the last day.   Known history of severe hemorrhoids with significant hemorrhoidal bleeding in the past.   symptoms are similar to previous admissions for this.  Previously was recommended for hemorrhoidectomy but was lost to follow-up.    Denies fevers, chills, chest pain, abdominal pain, nausea, vomiting.   ED Course: Vital signs in the ED have remained stable, blood pressure in the 110s systolic.  Lab workup included CMP with glucose 101, protein 6.1.  CBC with hemoglobin of 4.6 down from previous of 9.48 months ago with MCV of 74.  FOBT positive.  Patient typed and screened in the ED.  3 units ordered for transfusion.  GI consulted and given previous known high-grade hemorrhoids with recommendation for hemorrhoidectomy they recommended to continue with transfusion and consult general surgery.  General surgery consulted and EDP is awaiting call back.  Assessment & Plan:   Principal Problem:   Acute GI bleeding Active Problems:   Anemia   Hemorrhoids   Glaucoma (increased eye pressure)   Essential hypertension  Acute blood loss anemia secondary to rectal bleeding due to internal hemorrhoids, POA: Previously hemorrhoidectomy had been recommended but patient was lost to follow-up. Hemoglobin now 4.6 and baseline of 9.4, 9 months ago, received 3 units of PRBC transfusion, hemoglobin 9.7.  GI consulted however they recommended general surgery consultation for hemorrhoidectomy and general surgery is following.  Discussed with Dr. Dorrie Gaudier, no plans for surgery today  and perhaps may not be during this hospitalization however he mentioned that Dr. Delane Fear will see patient tomorrow and further decide.  She is NPO.  Will let her eat and keep her n.p.o. from midnight again today.   Hypertension - Not on any medication for this, will start on as needed IV hydralazine  for now.  Hypokalemia: Replenished.   Glaucoma - Continue home Cosopt  and Xalatan    DVT prophylaxis: SCDs Start: 03/13/24 1634   Code Status: Full Code  Family Communication: Husband present at bedside.  Plan of care discussed with patient in length and he/she verbalized understanding and agreed with it.  Status is: Inpatient Remains inpatient appropriate because: Per general surgery, they will reassess her tomorrow.   Estimated body mass index is 23.68 kg/m as calculated from the following:   Height as of this encounter: 5\' 5"  (1.651 m).   Weight as of this encounter: 64.5 kg.    Nutritional Assessment: Body mass index is 23.68 kg/m.Aaron Aas Seen by dietician.  I agree with the assessment and plan as outlined below: Nutrition Status:        . Skin Assessment: I have examined the patient's skin and I agree with the wound assessment as performed by the wound care RN as outlined below:    Consultants:  General surgery  Procedures:  None  Antimicrobials:  Anti-infectives (From admission, onward)    None         Subjective: Patient seen and examined.  She has no complaint at the moment.  Husband at the bedside.  He is extremely concerned with patient going home without having the surgery this time.  He was encouraged to discuss  his concerns with general surgery.  Objective: Vitals:   03/13/24 2326 03/14/24 0157 03/14/24 0512 03/14/24 0847  BP: (!) 161/88 (!) 148/86 (!) 158/83 (!) 147/75  Pulse: 74 72 78   Resp: 20  16 18   Temp: 98.4 F (36.9 C)  98.2 F (36.8 C)   TempSrc: Oral  Oral   SpO2: 100% 99% 100% 100%  Weight:      Height:        Intake/Output Summary  (Last 24 hours) at 03/14/2024 0857 Last data filed at 03/14/2024 0157 Gross per 24 hour  Intake 1410 ml  Output --  Net 1410 ml   Filed Weights   03/13/24 1441 03/13/24 1930  Weight: 70.3 kg 64.5 kg    Examination:  General exam: Appears calm and comfortable  Respiratory system: Clear to auscultation. Respiratory effort normal. Cardiovascular system: S1 & S2 heard, RRR. No JVD, murmurs, rubs, gallops or clicks. No pedal edema. Gastrointestinal system: Abdomen is nondistended, soft and nontender. No organomegaly or masses felt. Normal bowel sounds heard. Central nervous system: Alert and oriented. No focal neurological deficits. Extremities: Symmetric 5 x 5 power. Skin: No rashes, lesions or ulcers Psychiatry: Judgement and insight appear normal. Mood & affect appropriate.    Data Reviewed: I have personally reviewed following labs and imaging studies  CBC: Recent Labs  Lab 03/13/24 1458 03/14/24 0440  WBC 5.2 5.3  HGB 4.6* 9.7*  HCT 18.3* 31.6*  MCV 74.4* 78.4*  PLT 345 246   Basic Metabolic Panel: Recent Labs  Lab 03/13/24 1458 03/14/24 0440  NA 138 138  K 3.9 3.2*  CL 108 106  CO2 22 21*  GLUCOSE 101* 93  BUN 5* <5*  CREATININE 0.65 0.68  CALCIUM  9.3 9.3   GFR: Estimated Creatinine Clearance: 66.5 mL/min (by C-G formula based on SCr of 0.68 mg/dL). Liver Function Tests: Recent Labs  Lab 03/13/24 1458 03/14/24 0440  AST 18 15  ALT 14 12  ALKPHOS 38 34*  BILITOT 1.7* 2.3*  PROT 6.1* 5.8*  ALBUMIN 3.7 3.5   No results for input(s): "LIPASE", "AMYLASE" in the last 168 hours. No results for input(s): "AMMONIA" in the last 168 hours. Coagulation Profile: No results for input(s): "INR", "PROTIME" in the last 168 hours. Cardiac Enzymes: No results for input(s): "CKTOTAL", "CKMB", "CKMBINDEX", "TROPONINI" in the last 168 hours. BNP (last 3 results) No results for input(s): "PROBNP" in the last 8760 hours. HbA1C: No results for input(s): "HGBA1C" in the  last 72 hours. CBG: No results for input(s): "GLUCAP" in the last 168 hours. Lipid Profile: No results for input(s): "CHOL", "HDL", "LDLCALC", "TRIG", "CHOLHDL", "LDLDIRECT" in the last 72 hours. Thyroid Function Tests: No results for input(s): "TSH", "T4TOTAL", "FREET4", "T3FREE", "THYROIDAB" in the last 72 hours. Anemia Panel: Recent Labs    03/13/24 1700  FERRITIN 6*  TIBC 416  IRON 13*  RETICCTPCT 1.5   Sepsis Labs: No results for input(s): "PROCALCITON", "LATICACIDVEN" in the last 168 hours.  No results found for this or any previous visit (from the past 240 hours).   Radiology Studies: No results found.  Scheduled Meds:  dorzolamide -timolol   1 drop Left Eye BID   latanoprost   1 drop Left Eye QHS   [START ON 03/18/2024] methotrexate  25 mg Oral Weekly   sodium chloride  flush  3 mL Intravenous Q12H   Continuous Infusions:  potassium chloride        LOS: 1 day   Modena Andes, MD [facility]  03/14/2024, 8:57 AM   *  Please note that this is a verbal dictation therefore any spelling or grammatical errors are due to the "Dragon [employer] One" system interpretation.  Please page via Amion and do not message via secure chat for urgent patient care matters. Secure chat can be used for non urgent patient care matters.  How to contact the [facility] Attending or Consulting provider 7A - 7P or covering provider during after hours 7P -7A, for this patient?  Check the care team in [facility] and look for a) attending/consulting [facility] provider listed and b) the [facility] team listed. Page or secure chat 7A-7P. Log into [email or web address] and use [place]'s universal password to access. If you do not have the password, please contact the hospital operator. Locate the [facility] provider you are looking for under [facility] and page to a number that you can be directly reached. If you still have difficulty reaching the provider, please page the [facility] (Director on Call) for the Hospitalists listed on  amion for assistance.

## 2024-03-14 NOTE — Progress Notes (Signed)
   Progress Note     Subjective: Bleeding still ongoing but less than it had been. Had a BM yesterday  Significant other at bedside Objective: Vital signs in last 24 hours: Temp:  [97.7 F (36.5 C)-98.4 F (36.9 C)] 98.2 F (36.8 C) (05/11 0512) Pulse Rate:  [72-93] 78 (05/11 0512) Resp:  [16-21] 18 (05/11 0847) BP: (119-161)/(75-92) 147/75 (05/11 0847) SpO2:  [94 %-100 %] 100 % (05/11 0847) Weight:  [64.5 kg-70.3 kg] 64.5 kg (05/10 1930)    Intake/Output from previous day: 05/10 0701 - 05/11 0700 In: 1410 [Blood:1410] Out: -  Intake/Output this shift: No intake/output data recorded.  PE: General: pleasant, WD, female who is laying in bed in NAD Lungs: .  Respiratory effort nonlabored MSK: all 4 extremities are symmetrical with no cyanosis, clubbing, or edema. GU: large external hemorrhoids with mild active bleeding. RN bedside as chaperone Skin: warm and dry  Psych: A&Ox3 with an appropriate affect.    Lab Results:  Recent Labs    03/13/24 1458 03/14/24 0440  WBC 5.2 5.3  HGB 4.6* 9.7*  HCT 18.3* 31.6*  PLT 345 246   BMET Recent Labs    03/13/24 1458 03/14/24 0440  NA 138 138  K 3.9 3.2*  CL 108 106  CO2 22 21*  GLUCOSE 101* 93  BUN 5* <5*  CREATININE 0.65 0.68  CALCIUM  9.3 9.3   PT/INR No results for input(s): "LABPROT", "INR" in the last 72 hours. CMP     Component Value Date/Time   NA 138 03/14/2024 0440   NA 144 05/09/2023 0832   K 3.2 (L) 03/14/2024 0440   CL 106 03/14/2024 0440   CO2 21 (L) 03/14/2024 0440   GLUCOSE 93 03/14/2024 0440   BUN <5 (L) 03/14/2024 0440   BUN 8 05/09/2023 0832   CREATININE 0.68 03/14/2024 0440   CALCIUM  9.3 03/14/2024 0440   PROT 5.8 (L) 03/14/2024 0440   PROT 6.1 05/09/2023 0832   ALBUMIN 3.5 03/14/2024 0440   ALBUMIN 4.2 05/09/2023 0832   AST 15 03/14/2024 0440   ALT 12 03/14/2024 0440   ALKPHOS 34 (L) 03/14/2024 0440   BILITOT 2.3 (H) 03/14/2024 0440   BILITOT 1.1 05/09/2023 0832   GFRNONAA >60  03/14/2024 0440   GFRAA >60 05/27/2020 1709   Lipase  No results found for: "LIPASE"     Studies/Results: No results found.  Anti-infectives: Anti-infectives (From admission, onward)    None        Assessment/Plan 63 yo female with severe anemia related to hemorrhoids -surgery will follow along. hemorrhoid surgery could be useful in prevention of next bleed. - hgb improved to 9.7 (4.6) after 3 u prbcs yesterday - mild active bleeding today -patient is underutilizing constipation treatments which could also help to avoid these hospitalizations   FEN - HH, NPO MN VTE - holding prophylaxis ID - no issues Admit - admitted to medicine  I reviewed hospitalist notes, last 24 h vitals and pain scores, last 48 h intake and output, last 24 h labs and trends, and last 24 h imaging results.    LOS: 1 day   Elwin Hammond, [facility] 03/14/2024, 12:13 PM Please see Amion for pager number during day hours 7:00am-4:30pm

## 2024-03-14 NOTE — Plan of Care (Signed)

## 2024-03-14 NOTE — Evaluation (Signed)
 Physical Therapy Evaluation Patient Details Name: Julie Dyer MRN: [id number] DOB: 01-25-1961 Today's Date: 03/14/2024  History of Present Illness  Julie Dyer is a 63 y.o. female admitted 03/13/24 for acute GI bleed. She presented with rectal bleeding, worsening fatigue, lightheadedness, and shortness of breath. CBC with hemoglobin of 4.6, received 3 units of PRBC transfusion, hemoglobin 9.7. General surgery consulted for hemorrhoidectomy. PMH significant for hypertension, anemia, GI bleed, hemorrhoids, and glaucoma.   Clinical Impression  Pt admitted with above diagnosis. At baseline, pt is independent with functional mobility without an AD and independent with ADLs and most IADLs. PTA, pt required increased assistance from her fiance for extra support and stability d/t fatigue and SOB. She lives with her fiance in a 2nd floor apartment with 16 STE and railings. Pt currently with functional limitations due to the deficits listed below (see PT Problem List). She performed functional mobility with largely supervision for safety. Pt ambulated ~41ft and ascended/descended two flights of stairs with unilateral UE support on handrail. She completed the DGI and scored 15/24, which indicates she is at increased fall risk. Pt will benefit from acute skilled PT to increase her independence and safety with mobility to allow discharge home. Recommend OPPT for balance training to decrease fall risk and maximize pt's independence with functional mobility.    If plan is discharge home, recommend the following: A little help with walking and/or transfers;A little help with bathing/dressing/bathroom;Assistance with cooking/housework;Assist for transportation;Help with stairs or ramp for entrance   Can travel by private vehicle        Equipment Recommendations None recommended by PT  Recommendations for Other Services       Functional Status Assessment Patient has had a recent decline in  their functional status and demonstrates the ability to make significant improvements in function in a reasonable and predictable amount of time.     Precautions / Restrictions Precautions Precautions: Fall Recall of Precautions/Restrictions: Intact Precaution/Restrictions Comments: visual deficits Restrictions Weight Bearing Restrictions Per Provider Order: No      Mobility  Bed Mobility Overal bed mobility: Modified Independent             General bed mobility comments: Pt performed supine<>sit with HOB elevated, no use of bedrails, physical assist, or VC for sequencing.    Transfers Overall transfer level: Needs assistance Equipment used: None Transfers: Sit to/from Stand Sit to Stand: Supervision           General transfer comment: Pt stood from lowest bed height pushing up with BUE support to reach standing and no physical assist. Good eccentric control with sitting.    Ambulation/Gait Ambulation/Gait assistance: Supervision Gait Distance (Feet): 400 Feet Assistive device: None Gait Pattern/deviations: Step-through pattern, Decreased stride length, Drifts right/left Gait velocity: WFL Gait velocity interpretation: 1.31 - 2.62 ft/sec, indicative of limited community ambulator   General Gait Details: Pt ambulated with a reciprocal gait pattern, even weight shift, equal step length, and good foot clearence. She became unsteady with higher level balance challenges. Pt denied SOB.  Stairs Stairs: Yes Stairs assistance: Supervision Stair Management: One rail Left, Forwards, Alternating pattern, Step to pattern Number of Stairs: 18 General stair comments: Pt alternated between a reciprocal gait pattern and a step-to pattern while ascending/descending stairs. She had no LOB and denied SOB.  Wheelchair Mobility     Tilt Bed    Modified Rankin (Stroke Patients Only)       Balance  Standardized Balance  Assessment Standardized Balance Assessment : Dynamic Gait Index   Dynamic Gait Index Level Surface: Normal Change in Gait Speed: Mild Impairment Gait with Horizontal Head Turns: Mild Impairment Gait with Vertical Head Turns: Moderate Impairment Gait and Pivot Turn: Mild Impairment Step Over Obstacle: Mild Impairment Step Around Obstacles: Mild Impairment Steps: Moderate Impairment Total Score: 15       Pertinent Vitals/Pain Pain Assessment Pain Assessment: No/denies pain    Home Living Family/patient expects to be discharged to:: Private residence Living Arrangements: Spouse/significant other Available Help at Discharge: Family;Available PRN/intermittently (Fiance works and also assists in caring for his mom who doesn't live with them. Limited other family members who could assist.) Type of Home: Apartment Home Access: Stairs to enter Entrance Stairs-Rails: Doctor, general practice of Steps: 16   Home Layout: One level Home Equipment: None      Prior Function Prior Level of Function : Independent/Modified Independent             Mobility Comments: Ambulates without AD. Denies fall history. Pt reports on her "bad days" she needs to hold onto her fiance for additional support and will fatigue quickly and feel SOB. ADLs Comments: Indep with ADLs and most IADLs. Pt reports on her "bad days" she needs help from her fiance with bathing, dresssing, or toileting as she will fatigue quickly and feel SOB.     Extremity/Trunk Assessment   Upper Extremity Assessment Upper Extremity Assessment: Overall WFL for tasks assessed;Right hand dominant    Lower Extremity Assessment Lower Extremity Assessment: Overall WFL for tasks assessed    Cervical / Trunk Assessment Cervical / Trunk Assessment: Normal  Communication   Communication Communication: No apparent difficulties    Cognition Arousal: Alert Behavior During Therapy: Lability   PT - Cognitive impairments:  No family/caregiver present to determine baseline                       PT - Cognition Comments: Pt A,Ox4. She demonstrated emotional lability ranging from happy to sad and tearful throughout session. Easily redirected. Following commands: Intact       Cueing Cueing Techniques: Verbal cues     General Comments General comments (skin integrity, edema, etc.): VSS on RA.    Exercises     Assessment/Plan    PT Assessment Patient needs continued PT services  PT Problem List Decreased balance;Decreased mobility       PT Treatment Interventions DME instruction;Gait training;Stair training;Functional mobility training;Therapeutic activities;Therapeutic exercise;Balance training;Neuromuscular re-education;Patient/family education    PT Goals (Current goals can be found in the Care Plan section)  Acute Rehab PT Goals Patient Stated Goal: Regain my independence and be an active member in the community PT Goal Formulation: With patient/family Time For Goal Achievement: 03/28/24 Potential to Achieve Goals: Good    Frequency Min 1X/week     Co-evaluation               AM-PAC PT "6 Clicks" Mobility  Outcome Measure Help needed turning from your back to your side while in a flat bed without using bedrails?: None Help needed moving from lying on your back to sitting on the side of a flat bed without using bedrails?: A Little Help needed moving to and from a bed to a chair (including a wheelchair)?: A Little Help needed standing up from a chair using your arms (e.g., wheelchair or bedside chair)?: A Little Help needed to walk in hospital room?: A Little Help needed climbing  3-5 steps with a railing? : A Little 6 Click Score: 19    End of Session Equipment Utilized During Treatment: Gait belt Activity Tolerance: Patient tolerated treatment well Patient left: in bed;with call bell/phone within reach;with family/visitor present Nurse Communication: Mobility status PT  Visit Diagnosis: Unsteadiness on feet (R26.81);Other abnormalities of gait and mobility (R26.89)    Time: 8295-6213 PT Time Calculation (min) (ACUTE ONLY): 29 min   Charges:   PT Evaluation $PT Eval Low Complexity: 1 Low PT Treatments $Gait Training: 8-22 mins PT General Charges $$ ACUTE PT VISIT: 1 Visit         Glenford Lanes, PT, DPT Acute Rehabilitation Services Office: [phone] Secure Chat Preferred  Riva Chester 03/14/2024, 4:48 PM

## 2024-03-15 ENCOUNTER — Other Ambulatory Visit (HOSPITAL_COMMUNITY): Payer: Self-pay

## 2024-03-15 DIAGNOSIS — K922 Gastrointestinal hemorrhage, unspecified: Secondary | ICD-10-CM | POA: Diagnosis not present

## 2024-03-15 LAB — CBC WITH DIFFERENTIAL/PLATELET
Abs Immature Granulocytes: 0.04 10*3/uL (ref 0.00–0.07)
Basophils Absolute: 0.1 10*3/uL (ref 0.0–0.1)
Basophils Relative: 1 %
Eosinophils Absolute: 0.1 10*3/uL (ref 0.0–0.5)
Eosinophils Relative: 2 %
HCT: 32 % — ABNORMAL LOW (ref 36.0–46.0)
Hemoglobin: 9.6 g/dL — ABNORMAL LOW (ref 12.0–15.0)
Immature Granulocytes: 1 %
Lymphocytes Relative: 14 %
Lymphs Abs: 1 10*3/uL (ref 0.7–4.0)
MCH: 23.9 pg — ABNORMAL LOW (ref 26.0–34.0)
MCHC: 30 g/dL (ref 30.0–36.0)
MCV: 79.6 fL — ABNORMAL LOW (ref 80.0–100.0)
Monocytes Absolute: 1.2 10*3/uL — ABNORMAL HIGH (ref 0.1–1.0)
Monocytes Relative: 17 %
Neutro Abs: 4.4 10*3/uL (ref 1.7–7.7)
Neutrophils Relative %: 65 %
Platelets: 284 10*3/uL (ref 150–400)
RBC: 4.02 MIL/uL (ref 3.87–5.11)
RDW: 21 % — ABNORMAL HIGH (ref 11.5–15.5)
WBC: 6.7 10*3/uL (ref 4.0–10.5)
nRBC: 0 % (ref 0.0–0.2)

## 2024-03-15 LAB — BASIC METABOLIC PANEL WITH GFR
Anion gap: 9 (ref 5–15)
BUN: <5 mg/dL — ABNORMAL LOW (ref 8–23)
CO2: 23 mmol/L (ref 22–32)
Calcium: 9.4 mg/dL (ref 8.9–10.3)
Chloride: 110 mmol/L (ref 98–111)
Creatinine, Ser: 0.69 mg/dL (ref 0.44–1.00)
GFR, Estimated: >60 mL/min (ref >60)
Glucose, Bld: 99 mg/dL (ref 70–99)
Potassium: 3.8 mmol/L (ref 3.5–5.1)
Sodium: 142 mmol/L (ref 135–145)

## 2024-03-15 MED ORDER — SENNA 8.6 MG PO TABS
2.0000 | ORAL_TABLET | Freq: Every day | ORAL | 0 refills | Status: AC
  Filled 2024-03-15: qty 60, 30d supply, fill #0

## 2024-03-15 MED ORDER — POLYETHYLENE GLYCOL 3350 17 GM/SCOOP PO POWD
17.0000 g | Freq: Every day | ORAL | 0 refills | Status: AC
Start: 2024-03-15 — End: ?
  Filled 2024-03-15: qty 238, 14d supply, fill #0

## 2024-03-15 MED ORDER — PSYLLIUM 95 % PO PACK
1.0000 | PACK | Freq: Two times a day (BID) | ORAL | Status: DC
  Administered 2024-03-15: 1 via ORAL
  Filled 2024-03-15 (×2): qty 1

## 2024-03-15 MED ORDER — WITCH HAZEL-GLYCERIN EX PADS: MEDICATED_PAD | CUTANEOUS | Status: DC | PRN

## 2024-03-15 MED ORDER — PSYLLIUM 95 % PO PACK
1.0000 | PACK | Freq: Two times a day (BID) | ORAL | 0 refills | Status: AC
  Filled 2024-03-15: qty 60, 30d supply, fill #0

## 2024-03-15 MED ORDER — HYDROCORTISONE ACETATE 25 MG RE SUPP
25.0000 mg | Freq: Two times a day (BID) | RECTAL | 0 refills | Status: AC
  Filled 2024-03-15: qty 12, 6d supply, fill #0

## 2024-03-15 MED ORDER — HYDROCORTISONE ACETATE 25 MG RE SUPP
25.0000 mg | Freq: Two times a day (BID) | RECTAL | Status: DC
  Administered 2024-03-15: 25 mg via RECTAL
  Filled 2024-03-15 (×2): qty 1

## 2024-03-15 MED ORDER — AMLODIPINE BESYLATE 5 MG PO TABS
5.0000 mg | ORAL_TABLET | Freq: Every day | ORAL | 0 refills | Status: AC
  Filled 2024-03-15: qty 30, 30d supply, fill #0

## 2024-03-15 MED ORDER — DOCUSATE SODIUM 100 MG PO CAPS
100.0000 mg | ORAL_CAPSULE | Freq: Two times a day (BID) | ORAL | 0 refills | Status: AC
  Filled 2024-03-15: qty 60, 30d supply, fill #0

## 2024-03-15 NOTE — Progress Notes (Signed)
 Mobility Specialist Progress Note;   03/15/24 1000  Mobility  Activity Ambulated with assistance in hallway  Level of Assistance Contact guard assist, steadying assist  Assistive Device None  Distance Ambulated (ft) 400 ft  Activity Response Tolerated well  Mobility Referral Yes  Mobility visit 1 Mobility  Mobility Specialist Start Time (ACUTE ONLY) 1000  Mobility Specialist Stop Time (ACUTE ONLY) 1010  Mobility Specialist Time Calculation (min) (ACUTE ONLY) 10 min   Pt agreeable to mobility. Required MinG assistance during ambulation for safety as pt had 2x LOB. VSS throughout and no c/o when asked. Pt left sitting on EoB with all needs met. Husband in room.   Janit Meline Mobility Specialist Please contact via SecureChat or [employer] [phone]

## 2024-03-15 NOTE — Plan of Care (Signed)

## 2024-03-15 NOTE — TOC CM/SW Note (Signed)
 Transition of Care [facility]) - Inpatient Brief Assessment   Patient Details  Name: Julie Dyer MRN: [id number] Date of Birth: 09/09/1961  [facility] CM/SW Contact:    Cosimo Diones, RN Phone Number: 03/15/2024, 12:18 PM   Clinical Narrative: Outpatient Physical Therapy arranged with the [facility] location. Ambulatory referral submitted. Office to call the patient with a visit time. Patient reports that she has transportation home. No further needs identified at this time.    Transition of Care Asessment: Insurance and Status: Insurance coverage has been reviewed Patient has primary care physician: Yes Home environment has been reviewed: reviewed Prior level of function:: independent Prior/Current Home Services: No current home services Social Drivers of Health Review: SDOH reviewed no interventions necessary Readmission risk has been reviewed: Yes Transition of care needs: no transition of care needs at this time

## 2024-03-15 NOTE — Progress Notes (Addendum)
 Progress Note     Subjective: Bleeding slowing down. Pain controlled. Patient states she has an eye surgery scheduled at [facility] 5/22 and a follow up appointment 5/29.    Hgb stable 9.6 from 9.7.  Significant other at bedside Objective: Vital signs in last 24 hours: Temp:  [97.9 F (36.6 C)-98.4 F (36.9 C)] 98.1 F (36.7 C) (05/12 0758) Pulse Rate:  [65-81] 77 (05/12 0758) Resp:  [16-20] 18 (05/12 0758) BP: (141-168)/(78-86) 159/86 (05/12 0758) SpO2:  [97 %-100 %] 99 % (05/12 0758) Last BM Date : 03/14/24  Intake/Output from previous day: 05/11 0701 - 05/12 0700 In: 240 [P.O.:240] Out: -  Intake/Output this shift: No intake/output data recorded.  PE: General: pleasant, WD, female who is laying in bed in NAD Lungs: .  Respiratory effort nonlabored MSK: all 4 extremities are symmetrical with no cyanosis, clubbing, or edema. GU: large external hemorrhoids vs prolapsed internal hemorrhoids with clotted blood visible at the anus. No active bleeding. Skin: warm and dry  Psych: A&Ox3 with an appropriate affect.    Lab Results:  Recent Labs    03/14/24 0440 03/15/24 0258  WBC 5.3 6.7  HGB 9.7* 9.6*  HCT 31.6* 32.0*  PLT 246 284   BMET Recent Labs    03/14/24 0440 03/15/24 0258  NA 138 142  K 3.2* 3.8  CL 106 110  CO2 21* 23  GLUCOSE 93 99  BUN <5* <5*  CREATININE 0.68 0.69  CALCIUM  9.3 9.4   PT/INR No results for input(s): "LABPROT", "INR" in the last 72 hours. CMP     Component Value Date/Time   NA 142 03/15/2024 0258   NA 144 05/09/2023 0832   K 3.8 03/15/2024 0258   CL 110 03/15/2024 0258   CO2 23 03/15/2024 0258   GLUCOSE 99 03/15/2024 0258   BUN <5 (L) 03/15/2024 0258   BUN 8 05/09/2023 0832   CREATININE 0.69 03/15/2024 0258   CALCIUM  9.4 03/15/2024 0258   PROT 5.8 (L) 03/14/2024 0440   PROT 6.1 05/09/2023 0832   ALBUMIN 3.5 03/14/2024 0440   ALBUMIN 4.2 05/09/2023 0832   AST 15 03/14/2024 0440   ALT 12 03/14/2024 0440   ALKPHOS 34 (L)  03/14/2024 0440   BILITOT 2.3 (H) 03/14/2024 0440   BILITOT 1.1 05/09/2023 0832   GFRNONAA >60 03/15/2024 0258   GFRAA >60 05/27/2020 1709   Lipase  No results found for: "LIPASE"     Studies/Results: No results found.  Anti-infectives: Anti-infectives (From admission, onward)    None        Assessment/Plan 63 yo female with severe anemia related to hemorrhoids - hgb improved to 9.7 and stable (4.6 on 5/10), received 3 u prbcs 5/10 - no active bleeding; add Anusol  BID and fiber today. - discussed with patient today. Bleeding has slowed down dramatically and hgb stable. No emergent nature to surgery. I will arrange outpatient follow up in our office the first week of June with a colorectal surgeon to discuss/schedule hemorrhoidectomy. She should continue outpatient stool softeners and fiber as an outpatient to keep her stools soft and to avoid hard stools/watery stools.    FEN - HH  VTE - holding prophylaxis ID - no issues Admit - admitted to medicine  I reviewed hospitalist notes, last 24 h vitals and pain scores, last 48 h intake and output, last 24 h labs and trends, and last 24 h imaging results.    LOS: 2 days   Charlott Converse, PA-C  [facility] 03/15/2024, 10:06 AM Please see Amion for pager number during day hours 7:00am-4:30pm

## 2024-03-15 NOTE — Discharge Summary (Signed)
 Physician Discharge Summary  Julie Dyer XWR:[id number] DOB: [birth year] DOA: 03/13/2024  PCP: Abraham Abo, MD  Admit date: 03/13/2024 Discharge date: 03/15/2024 30 Day Unplanned Readmission Risk Score    Flowsheet Row ED to Hosp-Admission (Current) from 03/13/2024 in [facility]  30 Day Unplanned Readmission Risk Score (%) 8.15 Filed at 03/15/2024 0801       This score is the patient's risk of an unplanned readmission within 30 days of being discharged (0 -100%). The score is based on dignosis, age, lab data, medications, orders, and past utilization.   Low:  0-14.9   Medium: 15-21.9   High: 22-29.9   Extreme: 30 and above          Admitted From: Home Disposition: Home  Recommendations for Outpatient Follow-up:  Follow up with PCP in 1-2 weeks Please obtain BMP/CBC in one week Follow-up with general surgery in first week of June Please follow up with your PCP on the following pending results: Unresulted Labs (From admission, onward)    None         Home Health: None Equipment/Devices: None  Discharge Condition: Stable CODE STATUS: Full code Diet recommendation: Regular, recommend to use more vegetables than red meat.  HPI: Julie Dyer is a 63 y.o. female with medical history significant of hypertension, anemia, GI bleed, hemorrhoids, glaucoma presenting with rectal bleeding.   Patient reports 4 days of bright red blood per rectum with worsening fatigue, lightheadedness, shortness of breath especially over the last day.   Known history of severe hemorrhoids with significant hemorrhoidal bleeding in the past.   symptoms are similar to previous admissions for this.  Previously was recommended for hemorrhoidectomy but was lost to follow-up.    Denies fevers, chills, chest pain, abdominal pain, nausea, vomiting.   ED Course: Vital signs in the ED have remained stable, blood pressure in the 110s systolic.  Lab workup included CMP with  glucose 101, protein 6.1.  CBC with hemoglobin of 4.6 down from previous of 9.48 months ago with MCV of 74.  FOBT positive.  Patient typed and screened in the ED.  3 units ordered for transfusion.  GI consulted and given previous known high-grade hemorrhoids with recommendation for hemorrhoidectomy they recommended to continue with transfusion and consult general surgery.  General surgery consulted and EDP is awaiting call back.  Subjective: Seen and examined.  Bleeding has subsided.  Husband at the bedside.  Patient has no complaint.  They already spoke to general surgeon and now they understand that the nature of the surgery is not urgent and they have advised her to follow-up outpatient in the first week of June.  They are in agreement with going home.  Brief/Interim Summary: Details below.  Acute blood loss anemia secondary to rectal bleeding due to internal hemorrhoids, POA: Previously hemorrhoidectomy had been recommended but patient was lost to follow-up. Hemoglobin now 4.6 and baseline of 9.4, 9 months ago, received 3 units of PRBC transfusion, hemoglobin 9.7.  GI consulted however they recommended general surgery consultation for hemorrhoidectomy and general surgery saw her but her bleeding has subsided, she was placed on steroid suppositories and patient has improved.  She has not required any further blood transfusion.  General surgery has recommended that she follows up in the first week of June as outpatient to schedule surgery.  Per patient's request, I have prescribed her Colace, senna, MiraLAX , psylium and steroid suppositories.   Hypertension - Not on any medication for this, blood pressure elevated,  she was managed with IV hydralazine .  I am discharging her on amlodipine  5 mg p.o. daily.   Hypokalemia: Resolved.   Glaucoma - Continue home Cosopt  and Xalatan    Discharge plan was discussed with patient and/or family member and they verbalized understanding and agreed with it.   Discharge Diagnoses:  Principal Problem:   Acute GI bleeding Active Problems:   Anemia   Hemorrhoids   Glaucoma (increased eye pressure)   Essential hypertension    Discharge Instructions   Allergies as of 03/15/2024   No Known Allergies      Medication List     TAKE these medications    amLODipine  5 MG tablet Commonly known as: NORVASC  Take 1 tablet (5 mg total) by mouth daily.   docusate sodium  100 MG capsule Commonly known as: Colace Take 1 capsule (100 mg total) by mouth 2 (two) times daily.   dorzolamide -timolol  2-0.5 % ophthalmic solution Commonly known as: COSOPT  Place 1 drop into the left eye 2 (two) times daily.   folic acid  1 MG tablet Commonly known as: FOLVITE  Take by mouth.   hydrocortisone  25 MG suppository Commonly known as: ANUSOL -HC Place 1 suppository (25 mg total) rectally 2 (two) times daily.   latanoprost  0.005 % ophthalmic solution Commonly known as: XALATAN  Place 1 drop into the left eye at bedtime.   methotrexate 2.5 MG tablet Commonly known as: RHEUMATREX Take 25 mg by mouth once a week. On Thursdays   polyethylene glycol powder 17 GM/SCOOP powder Commonly known as: GLYCOLAX /MIRALAX  Take 1 capful (17 g) by mouth daily.   psyllium 95 % Pack Commonly known as: HYDROCIL/METAMUCIL Take 1 packet by mouth 2 (two) times daily.   senna 8.6 MG Tabs tablet Commonly known as: SENOKOT Take 2 tablets (17.2 mg total) by mouth at bedtime.   Vitamin D-1000 Max St 25 MCG (1000 UT) tablet Generic drug: Cholecalciferol Take 1,000 Units by mouth daily.        Follow-up Information     Abraham Abo, MD Follow up in 1 week(s).   Specialty: Family Medicine Contact information: [address] [place] [address] [phone]                No Known Allergies  Consultations: General Surgery   Procedures/Studies: No results found.   Discharge Exam: Vitals:   03/15/24 0314 03/15/24 0758  BP: (!) 168/84  (!) 159/86  Pulse: 65 77  Resp: 18 18  Temp: 98 F (36.7 C) 98.1 F (36.7 C)  SpO2: 100% 99%   Vitals:   03/14/24 1954 03/15/24 0107 03/15/24 0314 03/15/24 0758  BP: (!) 150/78 (!) 148/86 (!) 168/84 (!) 159/86  Pulse: 79 81 65 77  Resp: 16 20 18 18   Temp: 98 F (36.7 C) 98.3 F (36.8 C) 98 F (36.7 C) 98.1 F (36.7 C)  TempSrc: Oral Oral Oral Oral  SpO2: 100% 97% 100% 99%  Weight:      Height:        General: Pt is alert, awake, not in acute distress Cardiovascular: RRR, S1/S2 +, no rubs, no gallops Respiratory: CTA bilaterally, no wheezing, no rhonchi Abdominal: Soft, NT, ND, bowel sounds + Extremities: no edema, no cyanosis    The results of significant diagnostics from this hospitalization (including imaging, microbiology, ancillary and laboratory) are listed below for reference.     Microbiology: No results found for this or any previous visit (from the past 240 hours).   Labs: BNP (last 3 results) No results  for input(s): "BNP" in the last 8760 hours. Basic Metabolic Panel: Recent Labs  Lab 03/13/24 1458 03/14/24 0440 03/15/24 0258  NA 138 138 142  K 3.9 3.2* 3.8  CL 108 106 110  CO2 22 21* 23  GLUCOSE 101* 93 99  BUN 5* <5* <5*  CREATININE 0.65 0.68 0.69  CALCIUM  9.3 9.3 9.4   Liver Function Tests: Recent Labs  Lab 03/13/24 1458 03/14/24 0440  AST 18 15  ALT 14 12  ALKPHOS 38 34*  BILITOT 1.7* 2.3*  PROT 6.1* 5.8*  ALBUMIN 3.7 3.5   No results for input(s): "LIPASE", "AMYLASE" in the last 168 hours. No results for input(s): "AMMONIA" in the last 168 hours. CBC: Recent Labs  Lab 03/13/24 1458 03/14/24 0440 03/15/24 0258  WBC 5.2 5.3 6.7  NEUTROABS  --   --  4.4  HGB 4.6* 9.7* 9.6*  HCT 18.3* 31.6* 32.0*  MCV 74.4* 78.4* 79.6*  PLT 345 246 284   Cardiac Enzymes: No results for input(s): "CKTOTAL", "CKMB", "CKMBINDEX", "TROPONINI" in the last 168 hours. BNP: Invalid input(s): "POCBNP" CBG: No results for input(s): "GLUCAP" in  the last 168 hours. D-Dimer No results for input(s): "DDIMER" in the last 72 hours. Hgb A1c No results for input(s): "HGBA1C" in the last 72 hours. Lipid Profile No results for input(s): "CHOL", "HDL", "LDLCALC", "TRIG", "CHOLHDL", "LDLDIRECT" in the last 72 hours. Thyroid function studies No results for input(s): "TSH", "T4TOTAL", "T3FREE", "THYROIDAB" in the last 72 hours.  Invalid input(s): "FREET3" Anemia work up Recent Labs    03/13/24 1700  FERRITIN 6*  TIBC 416  IRON 13*  RETICCTPCT 1.5   Urinalysis No results found for: "COLORURINE", "APPEARANCEUR", "LABSPEC", "PHURINE", "GLUCOSEU", "HGBUR", "BILIRUBINUR", "KETONESUR", "PROTEINUR", "UROBILINOGEN", "NITRITE", "LEUKOCYTESUR" Sepsis Labs Recent Labs  Lab 03/13/24 1458 03/14/24 0440 03/15/24 0258  WBC 5.2 5.3 6.7   Microbiology No results found for this or any previous visit (from the past 240 hours).  FURTHER DISCHARGE INSTRUCTIONS:   Get Medicines reviewed and adjusted: Please take all your medications with you for your next visit with your Primary MD   Laboratory/radiological data: Please request your Primary MD to go over all hospital tests and procedure/radiological results at the follow up, please ask your Primary MD to get all Hospital records sent to his/her office.   In some cases, they will be blood work, cultures and biopsy results pending at the time of your discharge. Please request that your primary care M.D. goes through all the records of your hospital data and follows up on these results.   Also Note the following: If you experience worsening of your admission symptoms, develop shortness of breath, life threatening emergency, suicidal or homicidal thoughts you must seek medical attention immediately by calling 911 or calling your MD immediately  if symptoms less severe.   You must read complete instructions/literature along with all the possible adverse reactions/side effects for all the Medicines  you take and that have been prescribed to you. Take any new Medicines after you have completely understood and accpet all the possible adverse reactions/side effects.    Do not drive when taking Pain medications or sleeping medications (Benzodaizepines)   Do not take more than prescribed Pain, Sleep and Anxiety Medications. It is not advisable to combine anxiety,sleep and pain medications without talking with your primary care practitioner   Special Instructions: If you have smoked or chewed Tobacco  in the last 2 yrs please stop smoking, stop any regular Alcohol  and or  any Recreational drug use.   Wear Seat belts while driving.   Please note: You were cared for by a hospitalist during your hospital stay. Once you are discharged, your primary care physician will handle any further medical issues. Please note that NO REFILLS for any discharge medications will be authorized once you are discharged, as it is imperative that you return to your primary care physician (or establish a relationship with a primary care physician if you do not have one) for your post hospital discharge needs so that they can reassess your need for medications and monitor your lab values  Time coordinating discharge: Over 30 minutes  SIGNED:   Modena Andes, MD  [facility] 03/15/2024, 11:33 AM *Please note that this is a verbal dictation therefore any spelling or grammatical errors are due to the "[employer] One" system interpretation. If 7PM-7AM, please contact night-coverage [email or web address]

## 2024-03-16 ENCOUNTER — Telehealth: Payer: Self-pay

## 2024-03-16 ENCOUNTER — Encounter (HOSPITAL_BASED_OUTPATIENT_CLINIC_OR_DEPARTMENT_OTHER): Payer: Self-pay | Admitting: Radiology

## 2024-03-16 ENCOUNTER — Ambulatory Visit (HOSPITAL_BASED_OUTPATIENT_CLINIC_OR_DEPARTMENT_OTHER)
Admission: RE | Admit: 2024-03-16 | Discharge: 2024-03-16 | Disposition: A | Discharge status: Home / Self Care | Source: Ambulatory Visit | Attending: Family Medicine | Admitting: Family Medicine

## 2024-03-16 DIAGNOSIS — Z1231 Encounter for screening mammogram for malignant neoplasm of breast: Secondary | ICD-10-CM | POA: Insufficient documentation

## 2024-03-16 NOTE — Transitions of Care (Post Inpatient/ED Visit) (Unsigned)
   03/16/2024  Name: Julie Dyer MRN: [id number] DOB: 13-Dec-1960  Today's TOC FU Call Status: Today's TOC FU Call Status:: Unsuccessful Call (1st Attempt) Unsuccessful Call (1st Attempt) Date: 03/16/24  Attempted to reach the patient regarding the most recent Inpatient/ED visit.  Follow Up Plan: Additional outreach attempts will be made to reach the patient to complete the Transitions of Care (Post Inpatient/ED visit) call.   Signature Darrall Ellison, LPN [facility] Nurse Health Advisor Direct Dial [phone]

## 2024-03-17 NOTE — Transitions of Care (Post Inpatient/ED Visit) (Unsigned)
   03/17/2024  Name: Julie Dyer MRN: [id number] DOB: [birth year]  Today's TOC FU Call Status: Today's TOC FU Call Status:: Unsuccessful Call (2nd Attempt) Unsuccessful Call (1st Attempt) Date: 03/16/24 Unsuccessful Call (2nd Attempt) Date: 03/17/24  Attempted to reach the patient regarding the most recent Inpatient/ED visit.  Follow Up Plan: Additional outreach attempts will be made to reach the patient to complete the Transitions of Care (Post Inpatient/ED visit) call.   Signature Darrall Ellison, LPN [facility] Nurse Health Advisor Direct Dial [phone]

## 2024-03-18 NOTE — Transitions of Care (Post Inpatient/ED Visit) (Signed)
 03/18/2024  Name: Julie Dyer MRN: [id number] DOB: August 05, 1961  Today's TOC FU Call Status: Today's TOC FU Call Status:: Successful TOC FU Call Completed Unsuccessful Call (1st Attempt) Date: 03/16/24 Unsuccessful Call (2nd Attempt) Date: 03/17/24 [facility] FU Call Complete Date: 03/18/24 Patient's Name and Date of Birth confirmed.  Transition Care Management Follow-up Telephone Call Date of Discharge: 03/15/24 Discharge Facility: [facility]) Type of Discharge: Inpatient Admission Primary Inpatient Discharge Diagnosis:: GI bleed How have you been since you were released from the hospital?: Better Any questions or concerns?: No  Items Reviewed: Did you receive and understand the discharge instructions provided?: Yes Medications obtained,verified, and reconciled?: Yes (Medications Reviewed) Any new allergies since your discharge?: No Dietary orders reviewed?: Yes Do you have support at home?: Yes People in Home [RPT]: spouse  Medications Reviewed Today: Medications Reviewed Today     Reviewed by Darrall Ellison, LPN (Licensed Practical Nurse) on 03/18/24 at 1147  Med List Status: <None>   Medication Order Taking? Sig Documenting Provider Last Dose Status Informant  amLODipine  (NORVASC ) 5 MG tablet [id number]  Take 1 tablet (5 mg total) by mouth daily. Pahwani, Ravi, MD  Active   Cholecalciferol (VITAMIN D-1000 MAX ST) 25 MCG (1000 UT) tablet [id number] No Take 1,000 Units by mouth daily. [provider] 03/13/2024 Morning Active Self, Pharmacy Records  docusate sodium  (COLACE) 100 MG capsule [id number]  Take 1 capsule (100 mg total) by mouth 2 (two) times daily. Modena Andes, MD  Active   dorzolamide -timolol  (COSOPT ) 22.3-6.8 MG/ML ophthalmic solution [id number] No Place 1 drop into the left eye 2 (two) times daily. [provider] 03/13/2024 Morning Active Self, Pharmacy Records  folic acid  (FOLVITE ) 1 MG tablet [id number] No Take by mouth. [provider] 03/13/2024 Morning Active Self, Pharmacy Records  hydrocortisone  (ANUSOL -HC) 25 MG suppository [id number]  Unwrap and place 1 suppository (25 mg total) rectally 2 (two) times daily. Modena Andes, MD  Active   latanoprost  (XALATAN ) 0.005 % ophthalmic solution [id number] No Place 1 drop into the left eye at bedtime. [provider] 03/13/2024 Morning Active Self, Pharmacy Records  methotrexate (RHEUMATREX) 2.5 MG tablet [id number] No Take 25 mg by mouth once a week. On Thursdays [provider] Past Week Active Self, Pharmacy Records  polyethylene glycol powder (GLYCOLAX /MIRALAX ) 17 GM/SCOOP powder [id number]  Take 1 capful (17 g) by mouth daily. Modena Andes, MD  Active   psyllium (HYDROCIL/METAMUCIL) 95 % PACK [id number]  Take 1 packet by mouth 2 (two) times daily. Modena Andes, MD  Active   senna (SENOKOT) 8.6 MG TABS tablet [id number]  Take 2 tablets (17.2 mg total) by mouth at bedtime. Modena Andes, MD  Active             Home Care and Equipment/Supplies: Were Home Health Services Ordered?: NA Any new equipment or medical supplies ordered?: NA  Functional Questionnaire: Do you need assistance with bathing/showering or dressing?: No Do you need assistance with meal preparation?: No Do you need assistance with eating?: No Do you have difficulty maintaining continence: No Do you need assistance with getting out of bed/getting out of a chair/moving?: No Do you have difficulty managing or taking your medications?: No  Follow up appointments reviewed: PCP Follow-up appointment confirmed?: No (sent message to Claudetta Cuba) MD Provider Line Number:[phone] Given: No Specialist Hospital Follow-up appointment confirmed?: No Reason Specialist Follow-Up Not Confirmed: Patient has Specialist Provider Number and will Call for Appointment Do you need transportation to your follow-up appointment?: No Do  you understand care options if your condition(s) worsen?:  Yes-patient verbalized understanding    SIGNATURE Darrall Ellison, LPN [facility] Nurse Health Advisor Direct Dial [phone]

## 2024-03-19 NOTE — Telephone Encounter (Signed)
 Tried to contact patient number is saying incorrect

## 2024-04-07 NOTE — Telephone Encounter (Signed)
 Unable to reach. Phone number not correct.

## 2024-04-13 NOTE — Telephone Encounter (Signed)
 Unable to reach. Incorrect number listed in chart.

## 2024-04-21 ENCOUNTER — Other Ambulatory Visit (HOSPITAL_COMMUNITY): Payer: Self-pay

## 2024-04-27 ENCOUNTER — Encounter: Payer: Self-pay | Admitting: Family Medicine

## 2024-04-27 ENCOUNTER — Ambulatory Visit: Payer: Self-pay | Admitting: General Surgery

## 2024-04-27 ENCOUNTER — Telehealth: Payer: Self-pay | Admitting: Family Medicine

## 2024-04-27 NOTE — H&P (Signed)
 REFERRING PHYSICIAN:  Debbe Jolynn Pack Me*  PROVIDER:  BERNARDA WANDA NED, MD  MRN: [id number] DOB: 11-26-1960 DATE OF ENCOUNTER: 04/27/2024  Subjective   Chief Complaint: New Consultation (recurrent bleeding from large, circumferential hemorrhoids)     History of Present Illness:   Pt recently admitted for anemia and bleeding hemorrhoids.  This resolved and she was discharged.     Review of Systems: A complete review of systems was obtained from the patient.  I have reviewed this information and discussed as appropriate with the patient.  See HPI as well for other ROS.     Medical History: Past Medical History:  Diagnosis Date   Eye swelling, left    Glaucoma    Glaucoma    Hemorrhoids    Hypertension    Motion sickness    as a child.   PONV (postoperative nausea and vomiting)     Patient Active Problem List  Diagnosis   Eye swelling, left   Glaucoma   Severe stage glaucoma   Sarcoidosis   High risk medication use   Hemorrhoids   Essential hypertension   Acute lower GI bleeding   Anemia   ABLA (acute blood loss anemia)    Past Surgical History:  Procedure Laterality Date   INSERTION AQUEOUS SHUNT Left 05/02/2023   Procedure: AQUEOUS SHUNT TO EXTRAOCULAR EQUATORIAL PLATE RESERVOIR, EXTERNAL APPROACH; WITH GRAFT;  Surgeon: Bethany Pleas, MD;  [facility];  Service: Ophthalmology;  Laterality: Left;   ORBITOTOMY EXPLORATION W/WO BIOPSY Left 04/01/2024   Procedure: ORBITOTOMY WITHOUT BONE FLAP (FRONTAL OR TRANSCONJUNCTIVAL APPROACH); FOR EXPLORATION, WITH OR WITHOUT BIOPSY;  Surgeon: Barrington Selinda Righter, MD;  [facility];  Service: Ophthalmology;  Laterality: Left;   IMPLANTATION BIOLOGIC IMPLANT Left 04/01/2024   Procedure: IMPLANTATION OF BIOLOGIC IMPLANT (EG, ACELLULAR DERMAL MATRIX) FORSOFT TISSUE REINFORCEMENT (IE, BREAST, TRUNK) (F);  Surgeon: Barrington Selinda Righter, MD;  [facility];  Service: Ophthalmology;   Laterality: Left;   CORRECTION LID RETRACTION Left 04/01/2024   Procedure: CORRECTION OF LID RETRACTION;  Surgeon: Barrington Selinda Righter, MD;  [facility];  Service: Ophthalmology;  Laterality: Left;     No Known Allergies  Current Outpatient Medications on File Prior to Visit  Medication Sig Dispense Refill   adalimumab-ryvk (SIMLANDI) 40 mg/0.4 mL auto-injector Inject 0.4 mLs (40 mg total) subcutaneously every 14 (fourteen) days 6 each 1   amLODIPine  (NORVASC ) 5 MG tablet Take 5 mg by mouth once daily     cetirizine (ZYRTEC) 10 MG tablet Take 1 tablet (10 mg total) by mouth once daily 30 tablet 11   cholecalciferol 1000 unit tablet Take 1 tablet (1,000 Units total) by mouth once daily 30 tablet 0   dorzolamide -timoloL  (COSOPT ) 22.3-6.8 mg/mL ophthalmic solution Place 1 drop into the left eye 2 (two) times daily     folic acid  (FOLVITE ) 1 MG tablet Take 1 tablet (1 mg total) by mouth once daily 45 tablet 3   hydrocortisone  (ANUSOL -HC) 25 mg suppository Place 25 mg rectally     latanoprost  (XALATAN ) 0.005 % ophthalmic solution Place 1 drop into the left eye at bedtime 2.5 mL 11   methotrexate  (RHEUMATREX) 2.5 MG tablet Take 10 tablets (25 mg total) by mouth every 7 (seven) days for 90 days Take 5 in the morning and 5 in the evening the same day (Thursday) 40 tablet 2   omeprazole (PRILOSEC) 20 MG DR capsule Take 1 capsule (20 mg total) by mouth once daily  30 capsule 11   traMADoL (ULTRAM) 50 mg tablet Take 1 tablet every 4-6 hours as needed for pain not relieved by Tylenol . 6 tablet 0   amLODIPine  (NORVASC ) 10 MG tablet Take 1 tablet by mouth once daily (Patient not taking: Reported on 04/27/2024)     brimonidine  (ALPHAGAN ) 0.2 % ophthalmic solution Place 1 drop into the left eye 2 (two) times daily (Patient not taking: Reported on 01/22/2024) 5 mL 12   ciprofloxacin HCl (CILOXAN) 0.3 % ophthalmic solution Place 1 drop into the left eye 4 (four) times daily Use until drops run out (Patient  not taking: Reported on 05/29/2023) 2.5 mL 0   erythromycin [facility]) ophthalmic ointment Place into the left eye at bedtime (Patient not taking: Reported on 04/09/2024) 3.5 g 3   latanoprostene bunod  (VYZULTA ) 0.024 % eye drops Place 1 drop into the left eye at bedtime (Patient not taking: Reported on 05/16/2023) 5 mL 0   lisinopriL (ZESTRIL) 40 MG tablet Take 1 tablet (40 mg total) by mouth once daily (Patient taking differently: Take 40 mg by mouth every morning) 30 tablet 0   prednisoLONE acetate (PRED FORTE) 1 % ophthalmic suspension Place 1 drop into the left eye 4 (four) times daily (Patient not taking: Reported on 07/10/2023) 10 mL 1   No current facility-administered medications on file prior to visit.    Family History  Problem Relation Age of Onset   Anesthesia problems Neg Hx      Social History   Tobacco Use  Smoking Status Never  Smokeless Tobacco Never     Social History   Socioeconomic History   Marital status: Married  Tobacco Use   Smoking status: Never   Smokeless tobacco: Never  Vaping Use   Vaping status: Never Used  Substance and Sexual Activity   Alcohol use: Never   Drug use: Never   Social Drivers of Corporate investment banker Strain: High Risk (01/27/2023)   Received from [facility]   Overall Financial Resource Strain (CARDIA)    Difficulty of Paying Living Expenses: Very hard  Food Insecurity: No Food Insecurity (03/14/2024)   Received from [facility]   Hunger Vital Sign    Within the past 12 months, you worried that your food would run out before you got the money to buy more.: Never true    Within the past 12 months, the food you bought just didn't last and you didn't have money to get more.: Never true  Transportation Needs: No Transportation Needs (03/14/2024)   Received from [facility] - Transportation    Lack of Transportation (Medical): No    Lack of Transportation (Non-Medical): No  Physical Activity: Unknown (01/27/2023)    Received from [facility]   Exercise Vital Sign    On average, how many days per week do you engage in moderate to strenuous exercise (like a brisk walk)?: 0 days  Stress: No Stress Concern Present (01/27/2023)   Received from [facility] Occupational [facility] - Occupational Stress Questionnaire    Feeling of Stress : Only a little  Social Connections: Socially Isolated (01/27/2023)   Received from [facility]   Social Connection and Isolation Panel    In a typical week, how many times do you talk on the phone with family, friends, or neighbors?: Never    How often do you get together with friends or relatives?: Never    How often do you attend church or  religious services?: Never    Do you belong to any clubs or organizations such as church groups, unions, fraternal or athletic groups, or school groups?: No    Are you married, widowed, divorced, separated, never married, or living with a partner?: Never married  Housing Stability: High Risk (01/01/2023)   Housing Stability Vital Sign    Unable to Pay for Housing in the Last Year: Yes    Number of Places Lived in the Last Year: 1    Unstable Housing in the Last Year: No    Objective:    Vitals:   04/27/24 1034  BP: (!) 180/110  Pulse: 60  Temp: 36.7 C (98.1 F)  Weight: 65.9 kg (145 lb 3.2 oz)  Height: 166.4 cm (5' 5.5)  PainSc: 0-No pain  PainLoc: Rectum    Recheck BP 184/102  Exam Gen: NAD Abd: soft Rectal: significant circumferential skin tags, non-inflamed   Labs, Imaging and Diagnostic Testing: Last colonoscopy 2022 [facility]   Procedure: Anoscopy Surgeon: Debby After the risks and benefits were explained, written consent was obtained for above procedure.  A medical assistant chaperone was present thoroughout the entire procedure.  Anesthesia: none Diagnosis: hemorrhoids Findings: grade 3 hemorrhoids all 3 locations   Assessment and Plan:  Diagnoses and all orders for this  visit:  Prolapsed internal hemorrhoids, grade 3   63 y.o. F with bleeding internal hemorrhoids causing anemia.  Given she has internal and external disease, I have recommended 3 column hemorrhoidectomy.  We have discussed the significant pain associated with this procedure as well as healing time and the low rate of recurrence.  I have asked her to start a daily fiber supplement starting now and continue after surgery.    Hypertension: I have asked her to contact her PCP to discuss management prior to scheduling surgery   Bernarda JAYSON Debby, MD Colon and Rectal Surgery [facility]

## 2024-04-27 NOTE — Telephone Encounter (Signed)
 A document form from Burnard Crete, LPN has been faxed: Surgical Clearance, to be filled out by provider. Send document back via Fax within 7-days to 10 business days. Document is located in providers tray at front office.          Fax number: [phone]  I called pt to schedule appt; could not reach or leave vm. Sent a Medical laboratory scientific officer to let pt know to call us  to schedule appt for this document to get filled with Dr. Tanda

## 2024-04-28 NOTE — Telephone Encounter (Signed)
 noted

## 2024-05-19 ENCOUNTER — Telehealth: Payer: Self-pay

## 2024-05-19 NOTE — Telephone Encounter (Signed)
 Medical Clearance form received. Dr. Tanda advised pt would need OV before she could complete the form.   Called pt to advise pt, no answer, unable to LVM.

## 2024-05-20 NOTE — Progress Notes (Signed)
 The Chief Complaint, HPI, ROS, and PFSHx as documented were reviewed with the patient by the physician and physician extender and updated as appropriate.  A/P 63 y/o F  S/p recession debulking and skin graft,  Left Correction Of Lid Retraction - Left, Left Full Thickness Graft, Free, Including Direct Closure Of Donor Site, Eyelid; 20 Sq Cm Or Less - Left, Left Temporary Closure Of Eyelids By Suture (eg, Frost Suture) - Left, and Orbitotomy Without Bone Flap (frontal Or Transconjunctival Approach); For Exploration, With Or Without Biopsy - Left (05/24/2024) - aim was for a small aperture and mostly closure, currently able to close eye fully and elevate LUL a few mm, not fully clearing pupil, able to tilt head back and see - Has not yet started adalimumab, reports she should receive the auto-injector in the mail tomorrow  PLAN: RTC late August or September with either Dr. Scot or Dr. Judyann for LUL follow up  This note is written by Selinda Beverley Hertz, COT in the presence of and acting as the scribe of Dr. Selinda Beverley Hertz, MD.

## 2024-06-18 ENCOUNTER — Telehealth: Payer: Self-pay

## 2024-06-18 ENCOUNTER — Telehealth: Payer: Self-pay | Admitting: Family Medicine

## 2024-06-18 NOTE — Telephone Encounter (Signed)
 Please schedule patient for an appointment because I have also been trying to get in contact with her regarding surgical clearance we received and she needs to be seen by Dr. Tanda before Dr. Tanda will complete the clearance    Copied from CRM #[id number]. Topic: General - Other >> Jun 18, 2024  2:08 PM Sophia H wrote: Reason for CRM: Patient states received a message from [facility] on 06/18 for a BP check. States she had been having issues seeing and was not able to find the message till now. I don't see anything in chart about this? Please follow up with patient # [phone]. Not the patients phone but please ask for her, will be around and have access to this phone.

## 2024-06-18 NOTE — Telephone Encounter (Signed)
 Error

## 2024-06-22 NOTE — Telephone Encounter (Signed)
 Pt scheduled for next available with pcp 08/04/2024. Called pt and could not reach or leave vm with appt details

## 2024-08-04 ENCOUNTER — Ambulatory Visit: Admitting: Family Medicine

## 2024-08-23 ENCOUNTER — Encounter: Payer: Self-pay | Admitting: Family Medicine

## 2024-08-23 ENCOUNTER — Ambulatory Visit (INDEPENDENT_AMBULATORY_CARE_PROVIDER_SITE_OTHER): Admitting: Family Medicine

## 2024-08-23 VITALS — BP 163/101 | HR 53 | Ht 65.0 in | Wt 153.2 lb

## 2024-08-23 DIAGNOSIS — I10 Essential (primary) hypertension: Secondary | ICD-10-CM

## 2024-08-23 DIAGNOSIS — H409 Unspecified glaucoma: Secondary | ICD-10-CM

## 2024-08-23 MED ORDER — AMLODIPINE BESYLATE 10 MG PO TABS: 10.0000 mg | ORAL_TABLET | Freq: Every day | ORAL | 0 refills | Status: DC

## 2024-08-24 ENCOUNTER — Encounter: Payer: Self-pay | Admitting: Family Medicine

## 2024-08-24 NOTE — Progress Notes (Signed)
 Established Patient Office Visit  Subjective    Patient ID: Julie Dyer, female    DOB: November 30, 1960  Age: 64 y.o. MRN: [id number]  CC:  Chief Complaint  Patient presents with   Medical Management of Chronic Issues    Medical clearance for surgery & BP recheck     HPI Julie Dyer presents for follow up of hypertension. Patient reports that she is wanting clearance for a medical procedure. Patient reports med compliance.   Outpatient Encounter Medications as of 08/23/2024  Medication Sig   amLODipine  (NORVASC ) 10 MG tablet Take 1 tablet (10 mg total) by mouth daily.   cetirizine (ZYRTEC) 10 MG tablet Take 10 mg by mouth daily.   Cholecalciferol (VITAMIN D-1000 MAX ST) 25 MCG (1000 UT) tablet Take 1,000 Units by mouth daily.   lisinopril (ZESTRIL) 40 MG tablet Take 40 mg by mouth daily.   amLODipine  (NORVASC ) 5 MG tablet Take 1 tablet (5 mg total) by mouth daily. (Patient not taking: Reported on 08/23/2024)   dorzolamide -timolol  (COSOPT ) 22.3-6.8 MG/ML ophthalmic solution Place 1 drop into the left eye 2 (two) times daily.   folic acid  (FOLVITE ) 1 MG tablet Take 1 mg by mouth daily.   hydrocortisone  (ANUSOL -HC) 25 MG suppository Unwrap and place 1 suppository (25 mg total) rectally 2 (two) times daily.   latanoprost  (XALATAN ) 0.005 % ophthalmic solution Place 1 drop into the left eye at bedtime.   polyethylene glycol powder (GLYCOLAX /MIRALAX ) 17 GM/SCOOP powder Take 1 capful (17 g) by mouth daily.   No facility-administered encounter medications on file as of 08/23/2024.    Past Medical History:  Diagnosis Date   Glaucoma    Hemorrhoids     Past Surgical History:  Procedure Laterality Date   COLONOSCOPY WITH PROPOFOL  N/A 06/01/2021   Procedure: COLONOSCOPY WITH PROPOFOL ;  Surgeon: Charlanne Groom, MD;  Location: [facility] ENDOSCOPY;  Service: Endoscopy;  Laterality: N/A;   ESOPHAGOGASTRODUODENOSCOPY (EGD) WITH PROPOFOL  N/A 09/10/2022   Procedure:  ESOPHAGOGASTRODUODENOSCOPY (EGD) WITH PROPOFOL ;  Surgeon: Abran Norleen SAILOR, MD;  Location: [facility] ENDOSCOPY;  Service: Gastroenterology;  Laterality: N/A;   GLAUCOMA SURGERY Left     Family History  Problem Relation Age of Onset   Diabetes Mother    Alcohol abuse Mother    Alcohol abuse Father     Social History   Socioeconomic History   Marital status: Single    Spouse name: Not on file   Number of children: Not on file   Years of education: Not on file   Highest education level: Master's degree (e.g., MA, MS, MEng, MEd, MSW, MBA)  Occupational History   Not on file  Tobacco Use   Smoking status: Never   Smokeless tobacco: Never  Vaping Use   Vaping status: Never Used  Substance and Sexual Activity   Alcohol use: Not Currently   Drug use: Not Currently   Sexual activity: Not on file  Other Topics Concern   Not on file  Social History Narrative   Not on file   Social Drivers of Health   Financial Resource Strain: High Risk (01/27/2023)   Overall Financial Resource Strain (CARDIA)    Difficulty of Paying Living Expenses: Very hard  Food Insecurity: No Food Insecurity (03/14/2024)   Hunger Vital Sign    Worried About Running Out of Food in the Last Year: Never true    Ran Out of Food in the Last Year: Never true  Transportation Needs: No Transportation Needs (03/14/2024)   PRAPARE -  Administrator, Civil Service (Medical): No    Lack of Transportation (Non-Medical): No  Physical Activity: Unknown (01/27/2023)   Exercise Vital Sign    Days of Exercise per Week: 0 days    Minutes of Exercise per Session: Not on file  Stress: No Stress Concern Present (01/27/2023)   [employer] of Occupational Health - Occupational Stress Questionnaire    Feeling of Stress : Only a little  Social Connections: Socially Isolated (01/27/2023)   Social Connection and Isolation Panel    Frequency of Communication with Friends and Family: Never    Frequency of Social Gatherings with  Friends and Family: Never    Attends Religious Services: Never    Database administrator or Organizations: No    Attends Engineer, structural: Not on file    Marital Status: Never married  Intimate Partner Violence: Not At Risk (03/14/2024)   Humiliation, Afraid, Rape, and Kick questionnaire    Fear of Current or Ex-Partner: No    Emotionally Abused: No    Physically Abused: No    Sexually Abused: No    Review of Systems  All other systems reviewed and are negative.       Objective    BP (!) 163/101   Pulse (!) 53   Ht 5' 5 (1.651 m)   Wt 153 lb 3.2 oz (69.5 kg)   SpO2 96%   BMI 25.49 kg/m   Physical Exam Vitals and nursing note reviewed.  Constitutional:      General: She is not in acute distress. Cardiovascular:     Rate and Rhythm: Normal rate and regular rhythm.  Pulmonary:     Effort: Pulmonary effort is normal.     Breath sounds: Normal breath sounds.  Abdominal:     Palpations: Abdomen is soft.     Tenderness: There is no abdominal tenderness.  Neurological:     General: No focal deficit present.     Mental Status: She is alert and oriented to person, place, and time.         Assessment & Plan:   Uncontrolled hypertension -     Ambulatory referral to Cardiology  Glaucoma, unspecified glaucoma type, unspecified laterality  Other orders -     amLODIPine  Besylate; Take 1 tablet (10 mg total) by mouth daily.  Dispense: 90 tablet; Refill: 0   Unable to clear patient for any medical procedure 2/2 extremely elevated blood pressure.   No follow-ups on file.   Tanda Raguel SQUIBB, MD

## 2024-11-20 ENCOUNTER — Other Ambulatory Visit: Payer: Self-pay | Admitting: Family Medicine

## 2024-11-22 NOTE — Telephone Encounter (Signed)
 Requested Prescriptions  Pending Prescriptions Disp Refills   amLODipine  (NORVASC ) 10 MG tablet [Pharmacy Med Name: AMLODIPINE  BESYLATE 10 MG TAB] 90 tablet 0    Sig: TAKE 1 TABLET BY MOUTH EVERY DAY     Cardiovascular: Calcium  Channel Blockers 2 Failed - 11/22/2024 11:08 AM      Failed - Last BP in normal range    BP Readings from Last 1 Encounters:  08/23/24 (!) 163/101         Passed - Last Heart Rate in normal range    Pulse Readings from Last 1 Encounters:  08/23/24 (!) 53         Passed - Valid encounter within last 6 months    Recent Outpatient Visits           3 months ago Uncontrolled hypertension   [place] [facility], MD   1 year ago Annual physical exam   [place] [facility], MD   1 year ago Sarcoidosis   [place] [facility], MD   2 years ago Hospital discharge follow-up   [facility], MD   3 years ago Hemorrhoids, unspecified hemorrhoid type   [facility], MD

## 2024-12-08 ENCOUNTER — Other Ambulatory Visit: Payer: Self-pay | Admitting: Family Medicine

## 2024-12-08 NOTE — Telephone Encounter (Signed)
 Copied from CRM #[id number]. Topic: Clinical - Medication Refill >> Dec 08, 2024  1:37 PM Delon DASEN wrote: Not seeing original prescriber Medication: lisinopril (ZESTRIL) 40 MG tablet  Has the patient contacted their pharmacy? No (Agent: If no, request that the patient contact the pharmacy for the refill. If patient does not wish to contact the pharmacy document the reason why and proceed with request.) (Agent: If yes, when and what did the pharmacy advise?)  This is the patient's preferred pharmacy:    [id number] - [place], [place] - [address] AT [address] [place] [address] Phone: [phone] Fax: [phone]  Is this the correct pharmacy for this prescription? Yes If no, delete pharmacy and type the correct one.   Has the prescription been filled recently? Yes  Is the patient out of the medication? Yes  Has the patient been seen for an appointment in the last year OR does the patient have an upcoming appointment? Yes  Can we respond through MyChart? Yes  Agent: Please be advised that Rx refills may take up to 3 business days. We ask that you follow-up with your pharmacy.

## 2024-12-10 NOTE — Telephone Encounter (Signed)
 Requested medication (s) are due for refill today: -  Requested medication (s) are on the active medication list: historical med  Last refill:  08/23/24  Future visit scheduled: yes  Notes to clinic:  historical provider   Requested Prescriptions  Pending Prescriptions Disp Refills   lisinopril (ZESTRIL) 40 MG tablet      Sig: Take 1 tablet (40 mg total) by mouth daily.     Cardiovascular:  ACE Inhibitors Failed - 12/10/2024 10:57 AM      Failed - Cr in normal range and within 180 days    Creatinine, Ser  Date Value Ref Range Status  03/15/2024 0.69 0.44 - 1.00 mg/dL Final         Failed - K in normal range and within 180 days    Potassium  Date Value Ref Range Status  03/15/2024 3.8 3.5 - 5.1 mmol/L Final         Failed - Last BP in normal range    BP Readings from Last 1 Encounters:  08/23/24 (!) 163/101         Passed - Patient is not pregnant      Passed - Valid encounter within last 6 months    Recent Outpatient Visits           3 months ago Uncontrolled hypertension   [place] [facility], MD   1 year ago Annual physical exam   [place] [facility], MD   1 year ago Sarcoidosis   [place] [facility], MD   2 years ago Hospital discharge follow-up   [facility], MD   3 years ago Hemorrhoids, unspecified hemorrhoid type   [facility], MD

## 2025-01-21 ENCOUNTER — Ambulatory Visit: Payer: Self-pay | Admitting: Family Medicine
